# Patient Record
Sex: Male | Born: 1950 | Race: White | Hispanic: No | Marital: Married | Smoking: Never smoker
Health system: Southern US, Community
[De-identification: ages and names within clinical notes are randomized; demographics above are authoritative.]

## PROBLEM LIST (undated history)

## (undated) DIAGNOSIS — Z98811 Dental restoration status: Secondary | ICD-10-CM

## (undated) DIAGNOSIS — N189 Chronic kidney disease, unspecified: Secondary | ICD-10-CM

## (undated) DIAGNOSIS — M75120 Complete rotator cuff tear or rupture of unspecified shoulder, not specified as traumatic: Secondary | ICD-10-CM

## (undated) DIAGNOSIS — J45909 Unspecified asthma, uncomplicated: Secondary | ICD-10-CM

## (undated) DIAGNOSIS — R0981 Nasal congestion: Secondary | ICD-10-CM

## (undated) DIAGNOSIS — I499 Cardiac arrhythmia, unspecified: Secondary | ICD-10-CM

## (undated) DIAGNOSIS — M7521 Bicipital tendinitis, right shoulder: Secondary | ICD-10-CM

## (undated) DIAGNOSIS — H269 Unspecified cataract: Secondary | ICD-10-CM

## (undated) DIAGNOSIS — E119 Type 2 diabetes mellitus without complications: Secondary | ICD-10-CM

## (undated) DIAGNOSIS — E7800 Pure hypercholesterolemia, unspecified: Secondary | ICD-10-CM

## (undated) DIAGNOSIS — Z794 Long term (current) use of insulin: Secondary | ICD-10-CM

## (undated) DIAGNOSIS — M24111 Other articular cartilage disorders, right shoulder: Secondary | ICD-10-CM

## (undated) DIAGNOSIS — Z87448 Personal history of other diseases of urinary system: Secondary | ICD-10-CM

## (undated) DIAGNOSIS — IMO0001 Reserved for inherently not codable concepts without codable children: Secondary | ICD-10-CM

## (undated) DIAGNOSIS — M1990 Unspecified osteoarthritis, unspecified site: Secondary | ICD-10-CM

## (undated) DIAGNOSIS — L908 Other atrophic disorders of skin: Secondary | ICD-10-CM

## (undated) HISTORY — PX: TONSILLECTOMY: SUR1361

## (undated) HISTORY — PX: KNEE ARTHROSCOPY: SHX127

## (undated) HISTORY — PX: TONSILLECTOMY: SHX5217

## (undated) HISTORY — PX: SHOULDER ARTHROSCOPY: SHX128

## (undated) HISTORY — PX: EYE SURGERY: SHX253

## (undated) HISTORY — PX: WRIST SURGERY: SHX841

## (undated) HISTORY — DX: Chronic kidney disease, unspecified: N18.9

---

## 1997-08-05 ENCOUNTER — Ambulatory Visit (HOSPITAL_BASED_OUTPATIENT_CLINIC_OR_DEPARTMENT_OTHER): Admission: RE | Admit: 1997-08-05 | Discharge: 1997-08-05 | Payer: Self-pay | Admitting: Orthopedic Surgery

## 1998-06-02 ENCOUNTER — Ambulatory Visit (HOSPITAL_BASED_OUTPATIENT_CLINIC_OR_DEPARTMENT_OTHER): Admission: RE | Admit: 1998-06-02 | Discharge: 1998-06-02 | Payer: Self-pay | Admitting: Orthopedic Surgery

## 2002-02-02 ENCOUNTER — Ambulatory Visit (HOSPITAL_COMMUNITY): Admission: RE | Admit: 2002-02-02 | Discharge: 2002-02-02 | Payer: Self-pay | Admitting: Gastroenterology

## 2004-05-29 ENCOUNTER — Ambulatory Visit: Payer: Self-pay | Admitting: Internal Medicine

## 2004-05-30 ENCOUNTER — Ambulatory Visit (HOSPITAL_COMMUNITY): Admission: RE | Admit: 2004-05-30 | Discharge: 2004-05-30 | Payer: Self-pay | Admitting: Internal Medicine

## 2004-07-12 ENCOUNTER — Ambulatory Visit: Payer: Self-pay | Admitting: Internal Medicine

## 2005-08-17 ENCOUNTER — Ambulatory Visit: Payer: Self-pay | Admitting: Internal Medicine

## 2005-08-21 ENCOUNTER — Ambulatory Visit: Payer: Self-pay | Admitting: Internal Medicine

## 2005-08-30 ENCOUNTER — Ambulatory Visit: Payer: Self-pay | Admitting: Internal Medicine

## 2005-10-23 ENCOUNTER — Ambulatory Visit: Payer: Self-pay | Admitting: Internal Medicine

## 2005-10-26 ENCOUNTER — Ambulatory Visit: Payer: Self-pay | Admitting: Internal Medicine

## 2005-10-30 ENCOUNTER — Ambulatory Visit: Payer: Self-pay | Admitting: Internal Medicine

## 2005-11-06 ENCOUNTER — Ambulatory Visit: Payer: Self-pay | Admitting: Internal Medicine

## 2005-11-09 ENCOUNTER — Ambulatory Visit: Payer: Self-pay | Admitting: Internal Medicine

## 2005-11-13 ENCOUNTER — Ambulatory Visit: Payer: Self-pay | Admitting: Internal Medicine

## 2005-11-16 ENCOUNTER — Ambulatory Visit: Payer: Self-pay | Admitting: Internal Medicine

## 2005-11-20 ENCOUNTER — Ambulatory Visit: Payer: Self-pay | Admitting: Internal Medicine

## 2005-11-21 ENCOUNTER — Ambulatory Visit: Payer: Self-pay | Admitting: Internal Medicine

## 2005-11-27 ENCOUNTER — Ambulatory Visit: Payer: Self-pay | Admitting: Internal Medicine

## 2005-11-30 ENCOUNTER — Ambulatory Visit: Payer: Self-pay | Admitting: Internal Medicine

## 2005-12-04 ENCOUNTER — Ambulatory Visit: Payer: Self-pay | Admitting: Internal Medicine

## 2005-12-07 ENCOUNTER — Ambulatory Visit: Payer: Self-pay | Admitting: Internal Medicine

## 2005-12-11 ENCOUNTER — Ambulatory Visit: Payer: Self-pay | Admitting: Internal Medicine

## 2005-12-14 ENCOUNTER — Ambulatory Visit: Payer: Self-pay | Admitting: Internal Medicine

## 2005-12-18 ENCOUNTER — Ambulatory Visit: Payer: Self-pay | Admitting: Internal Medicine

## 2005-12-21 ENCOUNTER — Ambulatory Visit: Payer: Self-pay | Admitting: Internal Medicine

## 2005-12-25 ENCOUNTER — Ambulatory Visit: Payer: Self-pay | Admitting: Internal Medicine

## 2005-12-28 ENCOUNTER — Ambulatory Visit: Payer: Self-pay | Admitting: Internal Medicine

## 2005-12-31 ENCOUNTER — Ambulatory Visit: Payer: Self-pay | Admitting: Internal Medicine

## 2006-01-03 ENCOUNTER — Ambulatory Visit: Payer: Self-pay | Admitting: Internal Medicine

## 2006-01-08 ENCOUNTER — Ambulatory Visit: Payer: Self-pay | Admitting: Internal Medicine

## 2006-01-10 ENCOUNTER — Ambulatory Visit: Payer: Self-pay | Admitting: Internal Medicine

## 2006-01-11 ENCOUNTER — Ambulatory Visit: Payer: Self-pay | Admitting: Internal Medicine

## 2006-01-14 ENCOUNTER — Ambulatory Visit: Payer: Self-pay | Admitting: Internal Medicine

## 2006-01-17 ENCOUNTER — Ambulatory Visit: Payer: Self-pay | Admitting: Internal Medicine

## 2006-01-21 ENCOUNTER — Ambulatory Visit: Payer: Self-pay | Admitting: Internal Medicine

## 2006-01-22 ENCOUNTER — Ambulatory Visit: Payer: Self-pay | Admitting: Internal Medicine

## 2006-01-24 ENCOUNTER — Ambulatory Visit: Payer: Self-pay | Admitting: Internal Medicine

## 2006-01-29 ENCOUNTER — Ambulatory Visit: Payer: Self-pay | Admitting: Internal Medicine

## 2006-02-05 ENCOUNTER — Ambulatory Visit: Payer: Self-pay | Admitting: Internal Medicine

## 2006-02-11 ENCOUNTER — Ambulatory Visit: Payer: Self-pay | Admitting: Internal Medicine

## 2006-02-15 ENCOUNTER — Ambulatory Visit: Payer: Self-pay | Admitting: Internal Medicine

## 2006-02-19 ENCOUNTER — Ambulatory Visit: Payer: Self-pay | Admitting: Internal Medicine

## 2006-02-19 ENCOUNTER — Ambulatory Visit (HOSPITAL_COMMUNITY): Admission: RE | Admit: 2006-02-19 | Discharge: 2006-02-19 | Payer: Self-pay | Admitting: Internal Medicine

## 2006-02-22 ENCOUNTER — Ambulatory Visit: Payer: Self-pay | Admitting: Internal Medicine

## 2006-03-14 ENCOUNTER — Ambulatory Visit: Payer: Self-pay | Admitting: Internal Medicine

## 2006-09-10 ENCOUNTER — Ambulatory Visit: Payer: Self-pay | Admitting: Internal Medicine

## 2007-08-08 ENCOUNTER — Ambulatory Visit: Payer: Self-pay | Admitting: Internal Medicine

## 2007-09-08 DIAGNOSIS — E119 Type 2 diabetes mellitus without complications: Secondary | ICD-10-CM

## 2007-09-08 DIAGNOSIS — J3089 Other allergic rhinitis: Secondary | ICD-10-CM

## 2007-09-08 DIAGNOSIS — J302 Other seasonal allergic rhinitis: Secondary | ICD-10-CM | POA: Insufficient documentation

## 2007-09-08 DIAGNOSIS — J209 Acute bronchitis, unspecified: Secondary | ICD-10-CM | POA: Insufficient documentation

## 2007-09-09 ENCOUNTER — Ambulatory Visit: Payer: Self-pay | Admitting: Internal Medicine

## 2008-05-19 ENCOUNTER — Ambulatory Visit: Payer: Self-pay | Admitting: Internal Medicine

## 2008-06-07 LAB — COMPREHENSIVE METABOLIC PANEL
ALT: 22 U/L (ref 0–53)
AST: 22 U/L (ref 0–37)
Albumin: 4.1 g/dL (ref 3.5–5.2)
Alkaline Phosphatase: 46 U/L (ref 39–117)
CO2: 28 mEq/L (ref 19–32)
Calcium: 9.3 mg/dL (ref 8.4–10.5)
Creatinine, Ser: 1.13 mg/dL (ref 0.40–1.50)
Total Bilirubin: 0.6 mg/dL (ref 0.3–1.2)
Total Protein: 6 g/dL (ref 6.0–8.3)

## 2008-06-07 LAB — CBC WITH DIFFERENTIAL/PLATELET
Basophils Absolute: 0.1 10*3/uL (ref 0.0–0.1)
Eosinophils Absolute: 0.3 10*3/uL (ref 0.0–0.5)
HCT: 39.8 % (ref 38.4–49.9)
HGB: 13.6 g/dL (ref 13.0–17.1)
LYMPH%: 17.7 % (ref 14.0–49.0)
MCH: 33 pg (ref 27.2–33.4)
MCV: 97 fL (ref 79.3–98.0)
MONO#: 0.4 10*3/uL (ref 0.1–0.9)
NEUT%: 70.4 % (ref 39.0–75.0)
RDW: 11.9 % (ref 11.0–14.6)
WBC: 6.4 10*3/uL (ref 4.0–10.3)

## 2008-06-07 LAB — LACTATE DEHYDROGENASE: LDH: 171 U/L (ref 94–250)

## 2008-09-07 ENCOUNTER — Ambulatory Visit: Payer: Self-pay | Admitting: Internal Medicine

## 2008-09-08 ENCOUNTER — Encounter: Payer: Self-pay | Admitting: Internal Medicine

## 2008-09-09 ENCOUNTER — Telehealth (INDEPENDENT_AMBULATORY_CARE_PROVIDER_SITE_OTHER): Payer: Self-pay | Admitting: *Deleted

## 2009-09-06 ENCOUNTER — Ambulatory Visit: Payer: Self-pay | Admitting: Internal Medicine

## 2009-12-27 ENCOUNTER — Encounter: Payer: Self-pay | Admitting: Internal Medicine

## 2010-03-14 ENCOUNTER — Telehealth (INDEPENDENT_AMBULATORY_CARE_PROVIDER_SITE_OTHER): Payer: Self-pay | Admitting: *Deleted

## 2010-03-15 ENCOUNTER — Telehealth: Payer: Self-pay | Admitting: Internal Medicine

## 2010-04-27 NOTE — Assessment & Plan Note (Signed)
Summary: 12 months/apc   Primary Provider/Referring Provider:  Catha Gosselin   History of Present Illness: History of Present Illness: 09/09/07- 60-year-old man returning for one-year follow-up of asthma and allergic rhinitis.  Diabetes is his complicating problem.  He says this has been a very good year from a respiratory standpoint.  He considers symbicort wonderful, saying that it gave him his life back.  He has not needed a rescue inhaler.  We discussed medications and and environmental precautions. Denies headache, sinus drainage, sneezing chest pain, dyspnea, n/v/d, weight loss, fever, edema.  10-07-08- Asthma, allergic rhinitis Symbicort "gave him his life  back" without side effects or concerns. Uses Claritin daily, adding Zyrtec if he walks at lunch. Flonase also helps- denies nose bleeds.Meredeth Ide daily. Needs med refills as discussed.  September 06, 2009- Asthma, allergic rhinitis, bradyarrythmia In pollen season he notes some mid-day cough despite using Symbicort. Sneezes and some nasal itching. He does use claritin, supplementing with Zyrtec if needed. Has not had a rescue inhaler. Known slow/ irregfular heart rate followed by Dr Abigail Butts cardiology.   Preventive Screening-Counseling & Management  Alcohol-Tobacco     Smoking Status: never  Current Medications (verified): 1)  Lantus 100 Unit/ml  Soln (Insulin Glargine) .... Take 12 Units Daily 2)  Humalog 100 Unit/ml  Soln (Insulin Lispro (Human)) .... Take 18-20 Units Daily 3)  Pravachol 40 Mg  Tabs (Pravastatin Sodium) .... Take 1 Tablet By Mouth Once A Day 4)  Claritin-D 12 Hour 5-120 Mg  Tb12 (Loratadine-Pseudoephedrine) .... As Needed 5)  Zyrtec Allergy 10 Mg  Tabs (Cetirizine Hcl) 6)  Symbicort 160-4.5 Mcg/act  Aero (Budesonide-Formoterol Fumarate) .... Inhale 2 Puffs Two Times A Day 7)  Flonase 50 Mcg/act  Susp (Fluticasone Propionate) 8)  Mucinex Dm 30-600 Mg  Tb12 (Dextromethorphan-Guaifenesin) .... As  Needed  Allergies (verified): No Known Drug Allergies  Past History:  Past Medical History: Last updated: 09/09/2007 DIABETES MELLITUS (ICD-250.00) ALLERGIC RHINITIS (ICD-477.9) ASTHMATIC BRONCHITIS, ACUTE (ICD-466.0)  Past Surgical History: Last updated: 2008-10-07 Knees shoulders Tonsils  Family History: Last updated: October 07, 2008 Mother- died metastatic breast cancer Father- died respiratory failure- pulmonary edema Brother has Parkinson's  Social History: Last updated: 07-Oct-2008 Patient never smoked.  Married Attorney [employer]  Risk Factors: Smoking Status: never (09/06/2009)  Review of Systems      See HPI  The patient denies shortness of breath with activity, shortness of breath at rest, productive cough, non-productive cough, coughing up blood, chest pain, irregular heartbeats, acid heartburn, indigestion, loss of appetite, weight change, abdominal pain, difficulty swallowing, sore throat, tooth/dental problems, headaches, nasal congestion/difficulty breathing through nose, and sneezing.    Vital Signs:  Patient profile:   60 year old male Height:      72 inches Weight:      183 pounds BMI:     24.91 O2 Sat:      96 % on Room air Pulse rate:   48 / minute BP sitting:   122 / 84  (left arm) Cuff size:   regular  Vitals Entered By: Reynaldo Minium CMA (September 06, 2009 4:29 PM)  O2 Flow:  Room air   Physical Exam  Additional Exam:  GENERAL:  A/Ox3; pleasant & cooperative.NAD HEENT:  [place]/AT, EOM-wnl, PERRLA, EACs-clear, TMs-wnl, NOSE-csome bloody crusitng on mucosar, THROAT-clear & wnl., Mallampati II, mild hoarseness NECK:  Supple w/ fair ROM; no JVD; normal carotid impulses w/o bruits; no thyromegaly or nodules palpated; no lymphadenopathy. CHEST:Clear to P&A HEART: Slow- 48/min, bigeminal rhythm, no  m/r/g  heard ABDOMEN:  Soft & nt;  EXT: Warm bilat,  no calf pain, edema, clubbing, pulses intact Skin: no rash/lesion     Impression &  Recommendations:  Problem # 1:  ALLERGIC RHINITIS (ICD-477.9)  Fair control. Not using a steroid inhaler now. We discussed availability of Allegra as a comparison.; His updated medication list for this problem includes:    Zyrtec Allergy 10 Mg Tabs (Cetirizine hcl)    Flonase 50 Mcg/act Susp (Fluticasone propionate)  Orders: Est. Patient Level III ([address])  Problem # 2:  ASTHMATIC BRONCHITIS, ACUTE (ICD-466.0)  Very good control, except in midday sometimes. i will give him a rescue inhaler. Reason and technique for rescue inhaler were reviewed. His updated medication list for this problem includes:    Claritin-d 12 Hour 5-120 Mg Tb12 (Loratadine-pseudoephedrine) .Marland Kitchen... As needed    Symbicort 160-4.5 Mcg/act Aero (Budesonide-formoterol fumarate) ..... Inhale 2 puffs two times a day    Mucinex Dm 30-600 Mg Tb12 (Dextromethorphan-guaifenesin) .Marland Kitchen... As needed    Proair Hfa 108 (90 Base) Mcg/act Aers (Albuterol sulfate) .Marland Kitchen... 2 puffs, four times a day as needed rescue inhaler  Orders: Est. Patient Level III (60454)  Medications Added to Medication List This Visit: 1)  Proair Hfa 108 (90 Base) Mcg/act Aers (Albuterol sulfate) .... 2 puffs, four times a day as needed rescue inhaler  Patient Instructions: 1)  Please schedule a follow-up appointment in 1 year. 2)  Script for rescue inhaler sent to your drug store 3)  Call as needed for refills or problems Prescriptions: PROAIR HFA 108 (90 BASE) MCG/ACT AERS (ALBUTEROL SULFATE) 2 puffs, four times a day as needed rescue inhaler  #1 x prn   Entered and Authorized by:   Waymon Budge MD   Signed by:   Waymon Budge MD on 09/06/2009   Method used:   Electronically to        [facility]. [phone]* (retail)       [address]       [place], [place]  [address]       Ph: [phone]       Fax: [phone]   RxID:   [id number]

## 2010-04-27 NOTE — Progress Notes (Signed)
Summary: refill / [facility]  Phone Note Call from Patient   Caller: Patient Call For: young Summary of Call: pt requests refill of symbicort 90 days x 3 refills- [facility]. pt [phone] Initial call taken by: Tivis Ringer, CNA,  March 14, 2010 9:51 AM  Follow-up for Phone Call        Rx sent to [facility] with pt and notified this was done. Follow-up by: Vernie Murders,  March 14, 2010 11:12 AM    Prescriptions: SYMBICORT 160-4.5 MCG/ACT  AERO (BUDESONIDE-FORMOTEROL FUMARATE) Inhale 2 puffs two times a day  #3 x 3   Entered by:   Vernie Murders   Authorized by:   Waymon Budge MD   Signed by:   Vernie Murders on 03/14/2010   Method used:   Electronically to        [facility]* (retail)             ,          Ph: [phone]       Fax: [phone]   RxID:   [id number]

## 2010-04-27 NOTE — Progress Notes (Signed)
Summary: symbicort  Phone Note Call from Patient Call back at Home Phone [phone] Call back at [phone]   Caller: Patient Call For: young Reason for Call: Talk to Nurse Summary of Call: Patient requesting sample of symbicort.  Also, we sent rx to [facility], but when patient called [facility] they said they had a question about rx and we hadn't responded yet.  I told patient that nothing was documented in chart about [facility] contacting us.  Patient did not know what question was.  Can we contact [facility] to see what is going on with refill? Initial call taken by: Lehman Prom,  March 15, 2010 3:29 PM  Follow-up for Phone Call        I called [facility] and they state there is nothign needed for the symbicort Rx. I has actually been shipped out. I spoke to the pt and he states he is out of symbicort so I left a sample to last until shipment arrives. Nothinf further needed. Pt aware.Carron Curie CMA  March 15, 2010 5:25 PM

## 2010-04-27 NOTE — Medication Information (Signed)
Summary: Therapy Consideration for Symbicort/[facility]  Therapy Consideration for Symbicort/[facility]   Imported By: Sherian Rein 12/30/2009 14:07:35  _____________________________________________________________________  External Attachment:    Type:   Image     Comment:   External Document

## 2010-08-03 ENCOUNTER — Encounter: Payer: Self-pay | Admitting: Internal Medicine

## 2010-08-07 ENCOUNTER — Ambulatory Visit (INDEPENDENT_AMBULATORY_CARE_PROVIDER_SITE_OTHER): Payer: Federal, State, Local not specified - PPO | Admitting: Internal Medicine

## 2010-08-07 ENCOUNTER — Encounter: Payer: Self-pay | Admitting: Internal Medicine

## 2010-08-07 VITALS — BP 124/72 | HR 39 | Ht 72.0 in | Wt 177.8 lb

## 2010-08-07 DIAGNOSIS — J45909 Unspecified asthma, uncomplicated: Secondary | ICD-10-CM

## 2010-08-07 DIAGNOSIS — J4489 Other specified chronic obstructive pulmonary disease: Secondary | ICD-10-CM | POA: Insufficient documentation

## 2010-08-07 DIAGNOSIS — J449 Chronic obstructive pulmonary disease, unspecified: Secondary | ICD-10-CM | POA: Insufficient documentation

## 2010-08-07 DIAGNOSIS — J309 Allergic rhinitis, unspecified: Secondary | ICD-10-CM

## 2010-08-07 DIAGNOSIS — J4520 Mild intermittent asthma, uncomplicated: Secondary | ICD-10-CM

## 2010-08-07 MED ORDER — ALBUTEROL SULFATE HFA 108 (90 BASE) MCG/ACT IN AERS: 2.0000 | INHALATION_SPRAY | Freq: Four times a day (QID) | RESPIRATORY_TRACT | Status: DC | PRN

## 2010-08-07 MED ORDER — MOMETASONE FUROATE 50 MCG/ACT NA SUSP: 2.0000 | Freq: Every day | NASAL | Status: DC

## 2010-08-07 MED ORDER — BUDESONIDE-FORMOTEROL FUMARATE 80-4.5 MCG/ACT IN AERO: 2.0000 | INHALATION_SPRAY | Freq: Two times a day (BID) | RESPIRATORY_TRACT | Status: DC

## 2010-08-07 NOTE — Patient Instructions (Signed)
Meds refilled. Symbicort changed to the 80/4.5 strength- just lest cortisone. It may taqke a couple of months to decide if this affects your skin or your asthma. Let us hear if you have concerns.

## 2010-08-07 NOTE — Assessment & Plan Note (Signed)
We discussed control. He is using more than usual antihistamine currently, but not in a range that concerns me.

## 2010-08-07 NOTE — Progress Notes (Signed)
  Subjective:    Patient ID: Robert Baker, male    DOB: July 23, 1950, 60 y.o.   MRN: [id number]  HPI 08/07/10- 60 yoM never smoker, followed for asthma and allergic rhinitis, complicated by DM. Last here- September 06, 2009 when he was noting some persistent cough despite Symbicort. Bradycardia was followed by [facility] cardiology and not an issue. . This Spring he has needed to use a rescue inhaler about twice weekly- ? Due to pollen. Eyes have itched more and has felt some throat congestion with a little morning phlegm. Not being wakened and little aware of wheeze.  He continues to be physically quite active.   Review of Systems Constitutional:   No weight loss, night sweats,  Fevers, chills, fatigue, lassitude. HEENT:   No headaches,  Difficulty swallowing,  Tooth/dental problems,  Sore throat,            CV:  No- chest pain,  Orthopnea, PND, swelling in lower extremities, anasarca, dizziness, palpitations  GI  No- heartburn, indigestion, abdominal pain, nausea, vomiting, diarrhea, change in bowel habits, loss of appetite  Resp: No- shortness of breath with exertion or at rest.  No excess mucus, no productive cough,  No non-productive cough,  No coughing up of blood.  No change in color of mucus.  No wheezing. Skin: no rash or lesions.  GU: no- dysuria, change in color of urine, no urgency or frequency.  No flank pain.  MS:  No- joint pain or swelling.  No decreased range of motion.  No back pain.  Psych:  No- change in mood or affect. No depression or anxiety.  No memory loss. Skin- notes easy shearing and tearing of skin     Objective:   Physical Exam General- Alert, Oriented, Affect-appropriate, Distress- none acute  Skin- rash-none, lesions- none, excoriation- none  Lymphadenopathy- none  Head- atraumatic  Eyes- Gross vision intact, PERRLA, conjunctivae clear, secretions  Ears- Hearing, canals, Tm  -Normal  Nose- Clear,  No- Septal dev, mucus, polyps, erosion, perforation   Throat-  Mallampati II , mucosa clear , drainage- none, tonsils- atrophic  Neck- flexible , trachea midline, no stridor , thyroid nl, carotid no bruit  Chest - symmetrical excursion , unlabored     Heart/CV- RRR , no murmur , no gallop  , no rub, nl s1 s2                     - JVD- none , edema- none, stasis changes- none, varices- none     Lung- clear to P&A, wheeze- none, cough- none , dullness-none, rub- none     Chest wall-  Abd- tender-no, distended-no, bowel sounds-present, HSM- no  Br/ Gen/ Rectal- Not done, not indicated  Extrem- cyanosis- none, clubbing, none, atrophy- none, strength- nl  Neuro- grossly intact to observation         Assessment & Plan:

## 2010-08-07 NOTE — Assessment & Plan Note (Signed)
He has been well controlled with minor seasonal pollen exacerbation this Spring. With his observation that skin tears more easily, we will change Symbicort to 80/4.5 and see if that makes a difference.

## 2010-08-08 NOTE — Assessment & Plan Note (Signed)
[facility]                             PULMONARY OFFICE NOTE   Robert Baker, Robert Baker                           MRN:          [id number]  DATE:09/10/2006                            DOB:          May 03, 1950    PROBLEM:  1. Asthmatic bronchitis.  2. Allergic rhinitis.  3. Diabetes.   HISTORY:  He has continued to do extremely well using Symbicort 160/4.5.  He walks outdoors regularly and is using an exercise bicycle in his home  quite a lot.  He sees no problems with continuing present therapy.  We  discussed air quality.   MEDICATIONS:  1. Insulin.  2. Pravachol 40 mg.  3. Ferritin or Zyrtec.  4. Symbicort 160/4.5.  5. Flonase.  6. P.r.n. use of Mucinex.  7. Rescue albuterol inhaler.   No medication allergy.   OBJECTIVE:  Weight 183 pounds, BP 110/66.  Pulse regular 63.  Room air  saturation 98%.  He looks relaxed and comfortable.  Conjunctivae and nasal mucosa are normal.  CHEST:  Clear.  HEART:  Sounds are regular without murmur.   IMPRESSION:  1. Cough equivalent asthma/bronchitis.  2. Rhinitis.   PLAN:  Continue Symbicort.  Call for help earlier.  Otherwise, schedule  return in 1 year p.r.n.     Clinton D. Maple Hudson, MD, [facility] Bollman, FACP  Electronically Signed    CDY/MedQ  DD: 09/10/2006  DT: 09/11/2006  Job #: [id number]   cc:   Caryn Bee L. Little, M.D.

## 2010-08-09 ENCOUNTER — Telehealth: Payer: Self-pay | Admitting: Internal Medicine

## 2010-08-09 NOTE — Telephone Encounter (Signed)
Spoke with [facility]-gave correct Rx.Vivianne Spence

## 2010-08-11 NOTE — Assessment & Plan Note (Signed)
[facility]                               PULMONARY OFFICE NOTE   MAYFORD, ALBERG                           MRN:          [id number]  DATE:02/11/2006                            DOB:          12/24/1950    HISTORY:  Since I had last seen him in late September, he was seen October  18 by the nurse practitioner with acute tracheobronchitis and early  sinusitis for which he was put on 10 days of Augmentin and Mucinex DM. He  returns now saying that he has got a hacking cough 2 hours after eating and  he coughs all night. He coughs up chunks of white phlegm and hears himself  wheeze. He does not remember Advair in the past being very helpful. Cold air  now bothers his throat and chest. Chest x-ray in May had been normal.  Spirometry in April 2006 had been almost normal with minimal small airway  flow reduction responsive to bronchodilator. He has had a flu vaccine.   MEDICATIONS:  1. Insulin.  2. Pravachol 40 mg.  3. Zetia 10 mg.  4. Nasonex.  5. Either Claritin or Zyrtec.  6. Albuterol rescue inhaler.   OBJECTIVE:  VITAL SIGNS:  Weight 184 pounds, BP 110/68, pulse regular 54,  room air saturation 96%.  LUNGS:  Now clear with no cough or wheeze, work of breathing is not  increased. There is no neck vein distention or stridor.  HEART:  Sounds are regular without murmur.   IMPRESSION:  Exacerbation of bronchitis with asthma. I am concerned about  the possibility he may be aspirating.   PLAN:  1. Symbicort 160/4.5 two puffs b.i.d.  2. Schedule modified barium swallow with speech therapy assistance.  3. Schedule return in 1 month, earlier p.r.n.     Clinton D. Maple Hudson, MD, Tonny Bollman, FACP  Electronically Signed    CDY/MedQ  DD: 02/11/2006  DT: 02/12/2006  Job #: [id number]   cc:   Caryn Bee L. Little, M.D.

## 2010-08-11 NOTE — Assessment & Plan Note (Signed)
[facility]                               PULMONARY OFFICE NOTE   Robert Baker, Robert Baker                           MRN:          [id number]  DATE:12/31/2005                            DOB:          [birth year]    HISTORY OF PRESENT ILLNESS:  The patient is a 60 year old white male patient  of Dr. Roxy Cedar, who has a known history of asthmatic bronchitis and allergic  rhinitis.  The patient presents with a 2-week history of productive cough  with thick yellow sputum, nasal congestion and sinus pain and pressure.  The  patient denies any hemoptysis, orthopnea, PND or leg swelling.  No recent  travel or antibiotic use.   PAST MEDICAL HISTORY:  Reviewed.   CURRENT MEDICATIONS:  Reviewed.   PHYSICAL EXAMINATION:  The patient is a pleasant male in no acute distress.  He is afebrile, stable vital signs.  Pulse recheck is 64.  HEENT:  Nasal mucosa shows some mild erythema.  Nontender sinuses to  percussion.  The posterior pharynx is clear.  NECK:  Supple without cervical adenopathy.  No JVD.  LUNGS:  Lung sounds reveal coarse breath sounds without any wheezing or  crackles.  CARDIAC:  Regular rate and rhythm.  ABDOMEN:  Soft and benign.  EXTREMITIES:  Warm without any edema.   IMPRESSION AND PLAN:  Acute tracheobronchitis with a probable early  sinusitis.  The patient is to begin Augmentin for 10 days.  Use Mucinex DM  twice a day.  Nasal hygiene regimen as recommended.  The patient is to  return with Dr. Maple Hudson as recommended, or sooner if needed.  Endal HD as  needed for cough, #8 ounces without refills was given.      ______________________________  Rubye Oaks, NP    ______________________________  Rennis Chris. Maple Hudson, MD, [facility], FACP     TP/MedQ  DD:  01/10/2006  DT:  01/13/2006  Job #:  [id number]

## 2010-08-11 NOTE — Assessment & Plan Note (Signed)
[facility]                               PULMONARY OFFICE NOTE   MANOJ, ENRIQUEZ                           MRN:          [id number]  DATE:12/18/2005                            DOB:          October 02, 1950    PROBLEM:  1. Allergic rhinitis.  2. Persistent asthmatic bronchitis.  3. Rhinosinusitis.  4. Diabetes.   HISTORY:  He comes in to get his allergy vaccine filled up at this office  with no problems.  In the last two or three weeks, typical fall season, he  has noticed a little increased chest congestion and says he tends to cough  up mucus right after eating.  Sometimes he will wake at night and cough up  the same mucus.  He blames increased pollen for the sense of retro-orbital  pressure and nasal congestion.  He is on Mucinex and we discussed whether  that would increase the amount he needed to cough up or help clear it.  He  has been using his Advair inhaler only on an occasional p.r.n. basis and we  discussed appropriate use of that as a maintenance medicine.  He continues  to walk daily.   MEDICATIONS:  1. Insulin.  2. Pravachol 40 mg.  3. Zetia 10 mg.  4. Nasonex.  5. Occasional Claritin.  6. Occasional Advair 100/50.  7. Mucinex.  8. Sina-Fresh nasal rinse.   ALLERGIES:  No medication allergy.   OBJECTIVE:  VITAL SIGNS:  Weight 182 pounds, blood pressure 104/80, pulse  regular and 56, room air saturation 97%.  HEENT:  Nose and throat are distinctly clear.  There is no unusual amount of  mucus.  No evidence of inflammation or postnasal drip.  Voice quality is  normal.  NECK:  I find no adenopathy.  LUNGS:  Clear.  He admits that at the time of exam he feels quite  comfortable.  HEART:  Heart sounds are regular without murmur or gallop.   IMPRESSION:  This may be all his allergic rhinitis and a mild __________  asthma with past history of sinusitis, but association with meals does raise  question of whether or not he is having  some laryngeal penetration and we  discussed the possibility of a modified barium swallow.  He is going to pay  more attention to this and be more deliberate about his swallowing.  Meanwhile he will continue Nasonex, try changing from Claritin to Zyrtec 10  mg daily, and I have filled an albuterol rescue inhaler for him with  discussion.  Will schedule return for now to be in two  months.  By that time he will have reached maintenance on his allergy  vaccine.  Earlier p.r.n. and he is encouraged to call p.r.n.                                   Clinton D. Maple Hudson, MD, FCCP, FACP   CDY/MedQ  DD:  12/19/2005  DT:  12/20/2005  Job #:  [id number]  cc:   Caryn Bee L. Little, M.D.  Gloris Manchester. Lazarus Salines, M.D.  Dorisann Frames, M.D.

## 2010-08-11 NOTE — Op Note (Signed)
   NAME:  Robert Baker, Robert Baker NO.:  [id number]   MEDICAL RECORD NO.:  [id number]                   PATIENT TYPE:  AMB   LOCATION:  ENDO                                 FACILITY:  [facility]   PHYSICIAN:  Petra Kuba, M.D.                 DATE OF BIRTH:  Jan 18, 1951   DATE OF PROCEDURE:  02/02/2002  DATE OF DISCHARGE:                                 OPERATIVE REPORT   PROCEDURE PERFORMED:  Colonoscopy.   ENDOSCOPIST:  Petra Kuba, M.D.   INDICATIONS FOR PROCEDURE:  Patient with family history of colon polyps.  Due for colonic screening.  Consent was signed after the risks, benefits,  methods and options were thoroughly discussed in the office.   MEDICINES USED:  Demerol 70 mg, Versed 7 mg.   DESCRIPTION OF PROCEDURE:  Rectal inspection was pertinent for external  hemorrhoids, small.  Digital exam was negative.  A video pediatric  adjustable colonoscope was inserted and easily advanced around the colon to  the cecum.  This required some abdominal pressure but no position changes.  No obvious abnormality seen on insertion.  The cecum was identified by the  appendiceal orifice and the ileocecal valve.  In fact, the scope was  inserted a short ways into the terminal ileum, which was normal.  Photodocumentation was obtained.  The prep was adequate.  There was some  liquid stool that required washing and suctioning.  On slow withdrawal  through the colon no abnormalities were seen as we slowly withdrew back to  the rectum.  Once back to the rectum, the scope was retroflexed, pertinent  for some internal hemorrhoids.  Scope was straightened, and readvanced a  short ways up the left side of the colon.  Air was suctioned, scope removed.  The patient tolerated the procedure well without obvious complication.   ENDOSCOPIC DIAGNOSIS:  1. Internal and external hemorrhoids.  2. Otherwise within normal limits to the terminal ileum.   PLAN:  Yearly rectals and guaiacs  per Dr. Clarene Duke.  Happy to see back p.r.n.  Otherwise recommend repeat colonic screening in five years.                                               Petra Kuba, M.D.    MEM/MEDQ  D:  02/02/2002  T:  02/02/2002  Job:  [id number]   cc:   Caryn Bee L. Little, M.D.  [address]  [place]  [place] [address]  Fax: [phone]

## 2010-08-11 NOTE — Assessment & Plan Note (Signed)
[name]                               PULMONARY OFFICE NOTE   Robert, Baker                           MRN:          [id number]  DATE:10/23/2005                            DOB:          [birth year]    PROBLEMS:  1.  Allergic rhinitis.  2.  Persistent asthmatic bronchitis.  3.  Rhinosinusitis.   HISTORY OF PRESENT ILLNESS:  He returns today for allergy testing reporting  that Robert Baker had little effect.  He feels more congested in his head after  the evening dinner meal.  He has been using a saline nasal kettle and  SinoFresh.  Both of these seemed to help quite a bit for awhile, but  gradually he has felt more congestion and sneeze without headache.   MEDICATIONS:  Lantus, Humalog insulin, Pravachol, Zetia, Allegra 180,  Singulair, Robert Baker, Advair 100/50, Mucinex, SinoFresh.   ALLERGIES:  NO KNOWN DRUG ALLERGIES.   OBJECTIVE:  VITAL SIGNS:  Weight 180 pounds, BP 142/70, pulse regular and  60, room air saturation 94%.  HEENT:  Conjunctivae are no injected.  He might be minimally puffy under the  eyes, but I am not sure that is real.  Nasal airway is not frankly  obstructed, but mucosa looks a bit pale.  There is white mucus present.  Pharynx is not red.  LUNGS:  Clear of wheeze or cough.  HEART:  Heart sounds are regular without murmur.   SKIN TEST:  Positive histamine, negative diluent controls.  Significant  positive intradermal reactions primarily for grass, weed and tree pollens,  dust mite.   Chest x-ray done Aug 17, 2005, had been read as normal chest.  A CT of the  sinuses done May 30, 2004, had shown changes of sinusitis with fluid levels  in the maxillary sinuses bilaterally.   IMPRESSION:  1.  Allergic rhinitis.  2.  Sinusitis which has been previously addressed.  3.  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   We discussed further treatment options.  He has been consistent with use of  Robert Baker and with his Singulair and  bronchodilator medications.  He is  currently off of prednisone and antibiotics.  He wants to try allergy  vaccine.  We discussed allergy vaccine realistic expectations, limitations  and potential serious reactions including anaphylaxis very carefully.   PLAN:  We will begin allergy vaccine here and build to maintenance.  Scheduled to return with me in 2 months, earlier p.r.n.                                   Robert D. Maple Hudson, MD, FCCP, FACP   CDY/MedQ  DD:  10/24/2005  DT:  10/25/2005  Job #:  [id number]   cc:   Caryn Bee L. Little, MD  Gloris Manchester. Lazarus Salines, MD  Dorisann Frames, MD

## 2010-08-11 NOTE — Assessment & Plan Note (Signed)
[facility]                             PULMONARY OFFICE NOTE   LOTUS, Robert Baker                           MRN:          [id number]  DATE:03/14/2006                            DOB:          1950-06-17    PROBLEM:  1. Asthmatic bronchitis.  2. Allergic rhinitis.  3. Diabetes.   HISTORY:  Symbicort has worked very well.  He says it gave me back my  quality of life and in particular he finds he is sleeping through the  night.  His modified barium swallow did not show aspiration or  penetration but did suggest there might be some reflux.  We discussed  the possibility of a formal barium swallow but with symptomatic relief,  we agreed to use the Symbicort, practice reflux precaution and see what  happened.   MEDICATION:  1. Insulin.  2. Pravachol 40 mg.  3. Zetia 10 mg.  4. Zyrtec.  5. Symbicort 160/4.5.  6. Albuterol rescue inhaler.   No medication allergy.   OBJECTIVE:  Weight 187 pounds.  Blood pressure 102/60.  Pulse regular  63.  Room air saturation 98%.  Quiet, clear chest.  No coughing.  Heart sounds regular without murmur.   IMPRESSION:  Cough secondary to asthma or asthmatic bronchitis.  He  seems to have gotten good relief from Symbicort.  We spent time  discussing steroid side effects and issues of long-acting beta  adrenergic drugs.   PLAN:  1. Symbicort 160/4.5 was refilled.  2. Schedule a return in 6 months, earlier p.r.n.     Clinton D. Maple Hudson, MD, Tonny Bollman, FACP  Electronically Signed    CDY/MedQ  DD: 03/14/2006  DT: 03/15/2006  Job #: [id number]

## 2010-09-08 ENCOUNTER — Ambulatory Visit: Payer: Self-pay | Admitting: Internal Medicine

## 2011-08-07 ENCOUNTER — Encounter: Payer: Self-pay | Admitting: Internal Medicine

## 2011-08-07 ENCOUNTER — Ambulatory Visit (INDEPENDENT_AMBULATORY_CARE_PROVIDER_SITE_OTHER): Payer: Federal, State, Local not specified - PPO | Admitting: Internal Medicine

## 2011-08-07 VITALS — BP 136/80 | HR 42 | Ht 72.0 in | Wt 179.0 lb

## 2011-08-07 DIAGNOSIS — J45998 Other asthma: Secondary | ICD-10-CM

## 2011-08-07 DIAGNOSIS — J45909 Unspecified asthma, uncomplicated: Secondary | ICD-10-CM

## 2011-08-07 DIAGNOSIS — J302 Other seasonal allergic rhinitis: Secondary | ICD-10-CM

## 2011-08-07 DIAGNOSIS — J309 Allergic rhinitis, unspecified: Secondary | ICD-10-CM

## 2011-08-07 NOTE — Patient Instructions (Addendum)
Office spirometry- dx allergic and infective asthma--- done  Sample Dulera 100 steroid/ bronchodilator combo similar to Symbicort      See how 2 puffs and rinse, twice daily, compares with Symbicort

## 2011-08-07 NOTE — Progress Notes (Signed)
Subjective:    Patient ID: Robert Baker, male    DOB: 1950-09-08, 61 y.o.   MRN: [id number]  HPI 08/07/10- 60 yoM never smoker, followed for asthma and allergic rhinitis, complicated by DM. Last here- September 06, 2009 when he was noting some persistent cough despite Symbicort. Bradycardia was followed by [facility] cardiology and not an issue. . This Spring he has needed to use a rescue inhaler about twice weekly- ? Due to pollen. Eyes have itched more and has felt some throat congestion with a little morning phlegm. Not being wakened and little aware of wheeze.  He continues to be physically quite active.   08/07/11-60 yoM never smoker, followed for asthma and allergic rhinitis, complicated by DM. Chest congestion past month-unsure if related to pollen; Feels as he needs to go back up on Symbicort-having wheezing  He tried reducing Symbicort from 160-80, but had to increase Symbicort 80-4 puffs twice daily to keep asthma controlled. Failed Singulair years ago. Uses rescue inhaler about twice a week after increased exertion. Increased nasal congestion and rhinorrhea in the past month but does not use Flonase regularly.  ROS-see HPI Constitutional:   No-   weight loss, night sweats, fevers, chills, fatigue, lassitude. HEENT:   No-  headaches, difficulty swallowing, tooth/dental problems, sore throat,       +sneezing, itching, ear ache, nasal congestion, post nasal drip,  CV:  No-   chest pain, orthopnea, PND, swelling in lower extremities, anasarca, dizziness, palpitations Resp: + shortness of breath with exertion or at rest.              No-   productive cough,  No non-productive cough,  No- coughing up of blood.              No-   change in color of mucus.  + wheezing.   Skin: No-   rash or lesions. GI:  No-   heartburn, indigestion, abdominal pain, nausea, vomiting, GU: . MS:  No-   joint pain or swelling.   Neuro-     nothing unusual Psych:  No- change in mood or affect. No depression or anxiety.   No memory loss.  OBJ- Physical Exam General- Alert, Oriented, Affect-appropriate, Distress- none acute Skin- rash-none, lesions- none, excoriation- none Lymphadenopathy- none Head- atraumatic            Eyes- Gross vision intact, PERRLA, conjunctivae and secretions clear            Ears- Hearing, canals-normal            Nose- Clear, no-Septal dev, mucus, polyps, erosion, perforation             Throat- Mallampati II , mucosa clear , drainage- none, tonsils- atrophic Neck- flexible , trachea midline, no stridor , thyroid nl, carotid no bruit Chest - symmetrical excursion , unlabored           Heart/CV- RRR , no murmur , no gallop  , no rub, nl s1 s2                           - JVD- none , edema- none, stasis changes- none, varices- none           Lung- clear to P&A, wheeze- none, cough- none , dullness-none, rub- none           Chest wall-  Abd-  Br/ Gen/ Rectal- Not done, not indicated Extrem- cyanosis- none, clubbing, none, atrophy-  none, strength- nl Neuro- grossly intact to observation

## 2011-08-12 NOTE — Assessment & Plan Note (Signed)
Seasonal exacerbation. Plan-OTC antihistamines. Regular use of Flonase during peak season.

## 2011-08-12 NOTE — Progress Notes (Signed)
08/12/11 1909  Office Spirometry  FEV1 3.81 liters  FVC 4.96 liters  FEV1/FVC 76.8 %  FVC  % Predicted 99 liters  FEV % Predicted 98 liters  FeF 25-75 3.34 liters  FeF 25-75 % Predicted 94

## 2011-08-12 NOTE — Assessment & Plan Note (Signed)
Inadequate control with Symbicort 80. We are going to try Dulera 100 to see if a different steroid works any better for him, but he may need to return to Symbicort 160. Try Spiriva

## 2011-08-22 ENCOUNTER — Telehealth: Payer: Self-pay | Admitting: Internal Medicine

## 2011-08-22 MED ORDER — MOMETASONE FURO-FORMOTEROL FUM 100-5 MCG/ACT IN AERO: 2.0000 | INHALATION_SPRAY | Freq: Two times a day (BID) | RESPIRATORY_TRACT | Status: DC

## 2011-08-22 NOTE — Telephone Encounter (Signed)
I spoke with pt and he states he is doing better since starting the dulera 100 2 puffs BID. States this is better than the symbicort and can tell a difference in his breathing. Pt requesting 90 day supply [facility]. I advised pt will send rx and nothing further was needed

## 2012-01-25 ENCOUNTER — Telehealth: Payer: Self-pay | Admitting: Internal Medicine

## 2012-01-25 MED ORDER — ALBUTEROL SULFATE HFA 108 (90 BASE) MCG/ACT IN AERS: 2.0000 | INHALATION_SPRAY | Freq: Four times a day (QID) | RESPIRATORY_TRACT | Status: DC | PRN

## 2012-01-25 NOTE — Telephone Encounter (Signed)
Called and spoke with pt and he is aware that the rx for the albuterol has been sent to [facility] per his request.

## 2012-04-04 ENCOUNTER — Telehealth: Payer: Self-pay | Admitting: Internal Medicine

## 2012-04-04 MED ORDER — MOMETASONE FUROATE 50 MCG/ACT NA SUSP: 2.0000 | Freq: Every day | NASAL | Status: DC

## 2012-04-04 MED ORDER — FLUTICASONE PROPIONATE 50 MCG/ACT NA SUSP: 2.0000 | Freq: Every day | NASAL | Status: DC | PRN

## 2012-04-04 NOTE — Telephone Encounter (Signed)
Pt is requesting refills of the flonase and the nasonex.  Please advise CY if ok to send in both rx.  Thanks  Last ov 08/07/2011 Next ov  08/06/2012   No Known Allergies

## 2012-04-04 NOTE — Telephone Encounter (Signed)
Per CY-okay to refill both as requested.

## 2012-04-04 NOTE — Telephone Encounter (Signed)
rx for both meds have been sent to [facility] per pts request.   i called the pt and he is aware.  Pt was only requesting for the nasonex to be filled at this time.  This has been corrected and nothing further is needed.  `

## 2012-04-07 ENCOUNTER — Telehealth: Payer: Self-pay | Admitting: Internal Medicine

## 2012-04-07 NOTE — Telephone Encounter (Signed)
Leigh, msg states this was corrected on Friday.  Was this not the case?  Pls advise.  Thank you.

## 2012-04-07 NOTE — Telephone Encounter (Signed)
Need to call [facility] and cancel the rx for the flonase that was sent in on Friday.

## 2012-04-07 NOTE — Telephone Encounter (Signed)
Returning call.

## 2012-04-07 NOTE — Telephone Encounter (Signed)
i called to make the pt aware and he stated that he would rather have the flonase since this is cheaper.   He stated that the [facility] sent him an email and stated that the nasonex has been cancelled.   The flonase has already been shipped out.  i advised the pt that if he does receive the nasonex to call  The mailorder to see if they can take this back.  Pt very understanding and nothing further is needed.

## 2012-04-07 NOTE — Telephone Encounter (Signed)
i called [facility] and was told that the flonase has already been shipped out to the pt.   They stated that when the pt receives the shipment that he will need to call to get this returned to the  Pharmacy.    lmomtcb for pt to call me back to make him aware of this.

## 2012-04-07 NOTE — Telephone Encounter (Signed)
Leigh, msg was routed back to you and not closed.  Is there anything else that needs to be done with this msg?  Pls advise.  Thank you.

## 2012-04-18 NOTE — Telephone Encounter (Signed)
error 

## 2012-05-20 ENCOUNTER — Encounter: Payer: Self-pay | Admitting: Internal Medicine

## 2012-08-06 ENCOUNTER — Ambulatory Visit: Payer: Federal, State, Local not specified - PPO | Admitting: Internal Medicine

## 2012-08-07 ENCOUNTER — Encounter: Payer: Self-pay | Admitting: Internal Medicine

## 2012-08-07 ENCOUNTER — Ambulatory Visit (INDEPENDENT_AMBULATORY_CARE_PROVIDER_SITE_OTHER): Payer: Federal, State, Local not specified - PPO | Admitting: Internal Medicine

## 2012-08-07 VITALS — BP 122/82 | HR 44 | Ht 72.0 in | Wt 179.0 lb

## 2012-08-07 DIAGNOSIS — J45909 Unspecified asthma, uncomplicated: Secondary | ICD-10-CM

## 2012-08-07 DIAGNOSIS — J309 Allergic rhinitis, unspecified: Secondary | ICD-10-CM

## 2012-08-07 DIAGNOSIS — J302 Other seasonal allergic rhinitis: Secondary | ICD-10-CM

## 2012-08-07 DIAGNOSIS — J45998 Other asthma: Secondary | ICD-10-CM

## 2012-08-07 MED ORDER — AZELASTINE-FLUTICASONE 137-50 MCG/ACT NA SUSP: 1.0000 | Freq: Every day | NASAL | Status: DC

## 2012-08-07 NOTE — Progress Notes (Signed)
Subjective:    Patient ID: Robert Baker, male    DOB: 1951/03/21, 62 y.o.   MRN: [id number]  HPI 08/07/10- 60 yoM never smoker, followed for asthma and allergic rhinitis, complicated by DM. Last here- September 06, 2009 when he was noting some persistent cough despite Symbicort. Bradycardia was followed by [facility] cardiology and not an issue. . This Spring he has needed to use a rescue inhaler about twice weekly- ? Due to pollen. Eyes have itched more and has felt some throat congestion with a little morning phlegm. Not being wakened and little aware of wheeze.  He continues to be physically quite active.   08/07/11-60 yoM never smoker, followed for asthma and allergic rhinitis, complicated by DM. Chest congestion past month-unsure if related to pollen; Feels as he needs to go back up on Symbicort-having wheezing  He tried reducing Symbicort from 160-80, but had to increase Symbicort 80-4 puffs twice daily to keep asthma controlled. Failed Singulair years ago. Uses rescue inhaler about twice a week after increased exertion. Increased nasal congestion and rhinorrhea in the past month but does not use Flonase regularly.  08/07/12- 62 yoM never smoker, followed for asthma and allergic rhinitis, complicated by DM. FOLLOWS FOR: Increased congestion in throat area. Denies any SOb, wheezing, or cough at this time. Likes Dulera 100. Using rescue inhaler less than once per week. Some pollen rhinitis on Flonase.  ROS-see HPI Constitutional:   No-   weight loss, night sweats, fevers, chills, fatigue, lassitude. HEENT:   No-  headaches, difficulty swallowing, tooth/dental problems, sore throat,       +sneezing, itching, ear ache, +nasal congestion, post nasal drip,  CV:  No-   chest pain, orthopnea, PND, swelling in lower extremities, anasarca, dizziness, palpitations Resp: + shortness of breath with exertion or at rest.              No-   productive cough,  No non-productive cough,  No- coughing up of blood.           No-   change in color of mucus.  + wheezing.   Skin: No-   rash or lesions. GI:  No-   heartburn, indigestion, abdominal pain, nausea, vomiting, GU: . MS:  No-   joint pain or swelling.   Neuro-     nothing unusual Psych:  No- change in mood or affect. No depression or anxiety.  No memory loss.  OBJ- Physical Exam General- Alert, Oriented, Affect-appropriate, Distress- none acute Skin- rash-none, lesions- none, excoriation- none Lymphadenopathy- none Head- atraumatic            Eyes- Gross vision intact, PERRLA, conjunctivae and secretions clear            Ears- +Hearing aids            Nose- Clear, no-Septal dev, mucus, polyps, erosion, perforation             Throat- Mallampati II , mucosa clear , drainage- none, tonsils- atrophic Neck- flexible , trachea midline, no stridor , thyroid nl, carotid no bruit Chest - symmetrical excursion , unlabored           Heart/CV- RRR , no murmur , no gallop  , no rub, nl s1 s2                           - JVD- none , edema- none, stasis changes- none, varices- none  Lung- clear to P&A, wheeze- none, cough- none , dullness-none, rub- none           Chest wall-  Abd-  Br/ Gen/ Rectal- Not done, not indicated Extrem- cyanosis- none, clubbing, none, atrophy- none, strength- nl Neuro- grossly intact to observation

## 2012-08-07 NOTE — Patient Instructions (Addendum)
We can continue present meds  Sample Dymista nasal spray       Try 1-2 puffs each nostril once daily at bedtime       Ok to go back to your regular Flonase/ fluticasone when the sample is done

## 2012-08-18 ENCOUNTER — Encounter: Payer: Self-pay | Admitting: Internal Medicine

## 2012-08-18 NOTE — Assessment & Plan Note (Signed)
Rhinitis symptoms have been well-controlled with some impact from environmental humidity Plan-try Dymista nasal spray with discussion

## 2012-08-18 NOTE — Assessment & Plan Note (Signed)
Good medication control. He likes Dulera maintenance inhaler.

## 2013-01-29 ENCOUNTER — Other Ambulatory Visit: Payer: Self-pay

## 2013-05-08 ENCOUNTER — Telehealth: Payer: Self-pay | Admitting: Internal Medicine

## 2013-05-08 MED ORDER — MOMETASONE FURO-FORMOTEROL FUM 100-5 MCG/ACT IN AERO: 2.0000 | INHALATION_SPRAY | Freq: Two times a day (BID) | RESPIRATORY_TRACT | Status: DC

## 2013-05-08 NOTE — Telephone Encounter (Signed)
90-day supply Dulera 100 [facility] per pt request. Pt aware of upcoming appt 08/10/13 at 915 with CY (1 yr f/u)

## 2013-08-10 ENCOUNTER — Ambulatory Visit (INDEPENDENT_AMBULATORY_CARE_PROVIDER_SITE_OTHER): Payer: Federal, State, Local not specified - PPO | Admitting: Internal Medicine

## 2013-08-10 ENCOUNTER — Encounter: Payer: Self-pay | Admitting: Internal Medicine

## 2013-08-10 VITALS — BP 126/62 | HR 45 | Ht 72.0 in | Wt 178.0 lb

## 2013-08-10 DIAGNOSIS — J4520 Mild intermittent asthma, uncomplicated: Secondary | ICD-10-CM

## 2013-08-10 DIAGNOSIS — J309 Allergic rhinitis, unspecified: Secondary | ICD-10-CM

## 2013-08-10 DIAGNOSIS — J302 Other seasonal allergic rhinitis: Secondary | ICD-10-CM

## 2013-08-10 DIAGNOSIS — J45909 Unspecified asthma, uncomplicated: Secondary | ICD-10-CM

## 2013-08-10 NOTE — Patient Instructions (Signed)
Consider trying saline nasal gel for dry nose and protection from nose bleeds  Call for inhalers as needed

## 2013-08-10 NOTE — Progress Notes (Signed)
Subjective:    Patient ID: Robert GeeJames Baker, male    DOB: 1950-10-28, 63 y.o.   MRN: [id number]  HPI 08/07/10- 60 yoM never smoker, followed for asthma and allergic rhinitis, complicated by DM. Last here- September 06, 2009 when he was noting some persistent cough despite Symbicort. Bradycardia was [facility] by [facility]ology and not an issue. . This Spring he has needed to use a rescue inhaler about twice weekly- ? Due to pollen. Eyes have itched more and has felt some throat congestion with a little morning phlegm. Not being wakened and little aware of wheeze.  He continues to be physically quite active.   08/07/11-60 yoM never smoker, followed for asthma and allergic rhinitis, complicated by DM. Chest congestion past month-unsure if related to pollen; Feels as he needs to go back up on Symbicort-having wheezing  He tried reducing Symbicort from 160-80, but had to increase Symbicort 80-4 puffs twice daily to keep asthma controlled. Failed Singulair years ago. Uses rescue inhaler about twice a week after increased exertion. Increased nasal congestion and rhinorrhea in the past month but does not use Flonase regularly.  08/07/12- 62 yoM never smoker, followed for asthma and allergic rhinitis, complicated by DM. FOLLOWS FOR: Increased congestion in throat area. Denies any SOb, wheezing, or cough at this time. Likes Dulera 100. Using rescue inhaler less than once per week. Some pollen rhinitis on Flonase.  08/10/13- 63 yoM never smoker, followed for asthma and allergic rhinitis, complicated by DM. FOLLOWS FOR: congestion caught in throat area alot lately. Has sneezing and/or cough when outside and pollen count is high or mowing yard. Found saline nasal rinse helpful in the past. Using Flonase intermittently because steady use causes nosebleeds.  ROS-see HPI Constitutional:   No-   weight loss, night sweats, fevers, chills, fatigue, lassitude. HEENT:   No-  headaches, difficulty swallowing, tooth/dental  problems, sore throat,       +sneezing, itching, ear ache, +nasal congestion, +post nasal drip,  CV:  No-   chest pain, orthopnea, PND, swelling in lower extremities, anasarca, dizziness, palpitations Resp: + shortness of breath with exertion or at rest.              No-   productive cough,  No non-productive cough,  No- coughing up of blood.              No-   change in color of mucus.  + wheezing.   Skin: No-   rash or lesions. GI:  No-   heartburn, indigestion, abdominal pain, nausea, vomiting, GU: . MS:  No-   joint pain or swelling.   Neuro-     nothing unusual Psych:  No- change in mood or affect. No depression or anxiety.  No memory loss.  OBJ- Physical Exam General- Alert, Oriented, Affect-appropriate, Distress- none acute Skin- rash-none, lesions- none, excoriation- none Lymphadenopathy- none Head- atraumatic            Eyes- Gross vision intact, PERRLA, conjunctivae and secretions clear            Ears- +Hearing aids            Nose- Clear, no-Septal dev, mucus, polyps, erosion, perforation             Throat- Mallampati II , mucosa clear , drainage- none, tonsils- atrophic Neck- flexible , trachea midline, no stridor , thyroid nl, carotid no bruit Chest - symmetrical excursion , unlabored           Heart/CV-  RRR , no murmur , no gallop  , no rub, nl s1 s2                           - JVD- none , edema- none, stasis changes- none, varices- none           Lung- clear to P&A, wheeze- none, cough- none , dullness-none, rub- none           Chest wall-  Abd-  Br/ Gen/ Rectal- Not done, not indicated Extrem- cyanosis- none, clubbing, none, atrophy- none, strength- nl Neuro- grossly intact to observation

## 2013-09-26 NOTE — Assessment & Plan Note (Signed)
Currently good control 

## 2013-09-26 NOTE — Assessment & Plan Note (Signed)
Spring pollen exacerbation Plan-educated use a steroid nasal sprays, saline nasal gel, saline rinse/Neti pot

## 2013-12-24 DIAGNOSIS — M75120 Complete rotator cuff tear or rupture of unspecified shoulder, not specified as traumatic: Secondary | ICD-10-CM

## 2013-12-24 DIAGNOSIS — M24111 Other articular cartilage disorders, right shoulder: Secondary | ICD-10-CM

## 2013-12-24 DIAGNOSIS — M7521 Bicipital tendinitis, right shoulder: Secondary | ICD-10-CM

## 2013-12-24 HISTORY — DX: Bicipital tendinitis, right shoulder: M75.21

## 2013-12-24 HISTORY — DX: Complete rotator cuff tear or rupture of unspecified shoulder, not specified as traumatic: M75.120

## 2013-12-24 HISTORY — DX: Other articular cartilage disorders, right shoulder: M24.111

## 2013-12-31 ENCOUNTER — Ambulatory Visit (INDEPENDENT_AMBULATORY_CARE_PROVIDER_SITE_OTHER): Payer: Federal, State, Local not specified - PPO | Admitting: Podiatrist

## 2013-12-31 ENCOUNTER — Encounter: Payer: Self-pay | Admitting: Podiatrist

## 2013-12-31 VITALS — BP 126/76 | HR 77 | Resp 13 | Ht 71.0 in | Wt 175.0 lb

## 2013-12-31 DIAGNOSIS — E1140 Type 2 diabetes mellitus with diabetic neuropathy, unspecified: Secondary | ICD-10-CM

## 2013-12-31 DIAGNOSIS — M216X9 Other acquired deformities of unspecified foot: Secondary | ICD-10-CM

## 2013-12-31 DIAGNOSIS — Q828 Other specified congenital malformations of skin: Secondary | ICD-10-CM

## 2013-12-31 NOTE — Progress Notes (Signed)
   Subjective:    Patient ID: Robert Baker, Robert Baker    DOB: [birth year], 63 y.o.   MRN: [id number]  HPI Comments: Pt presents for diabetic foot exam and debridement of callouses to B/L 1st MPJ, B/L 5th metatarsals proximally, and left medial arch plantar.     Review of Systems  Skin:       Finger and toenails are dry and brittle.  All other systems reviewed and are negative.      Objective:   Physical Exam Patient is awake, alert, and oriented x 3.  In no acute distress.  Vascular status is intact with palpable pedal pulses at 2/4 DP and PT bilateral and capillary refill time within normal limits. Neurological sensation is also intact bilaterally via Semmes Weinstein monofilament at 4/5 sites- loss of sensation at the tips of bilateral halluces noted. Light touch, vibratory sensation, Achilles tendon reflex is intact. Dermatological exam reveals a hyperkeratotic lesion at the navicular of the left foot and lateral plantar left foot.  Right foot has a porokeratotic lesion laterally.  No open lesions present.  Musculature reveals pes planus deformity.       Assessment & Plan:  Diabetes, early neuropathy, porokeratotic lesions, pes planus  Plan:  Debridement of lesions accomplished today without compllications.  Recommended an accomidative type 3d orthotic with a deep heel seat for his hiking shoes.  Was scanned today and will double check with insurance to see about coverage and benefits as he recently had another pair made.

## 2013-12-31 NOTE — Patient Instructions (Signed)
Diabetes and Foot Care Diabetes may cause you to have problems because of poor blood supply (circulation) to your feet and legs. This may cause the skin on your feet to become thinner, break easier, and heal more slowly. Your skin may become dry, and the skin may peel and crack. You may also have nerve damage in your legs and feet causing decreased feeling in them. You may not notice minor injuries to your feet that could lead to infections or more serious problems. Taking care of your feet is one of the most important things you can do for yourself.  HOME CARE INSTRUCTIONS  Wear shoes at all times, even in the house. Do not go barefoot. Bare feet are easily injured.  Check your feet daily for blisters, cuts, and redness. If you cannot see the bottom of your feet, use a mirror or ask someone for help.  Wash your feet with warm water (do not use hot water) and mild soap. Then pat your feet and the areas between your toes until they are completely dry. Do not soak your feet as this can dry your skin.  Apply a moisturizing lotion or petroleum jelly (that does not contain alcohol and is unscented) to the skin on your feet and to dry, brittle toenails. Do not apply lotion between your toes.  Trim your toenails straight across. Do not dig under them or around the cuticle. File the edges of your nails with an emery board or nail file.  Do not cut corns or calluses or try to remove them with medicine.  Wear clean socks or stockings every day. Make sure they are not too tight. Do not wear knee-high stockings since they may decrease blood flow to your legs.  Wear shoes that fit properly and have enough cushioning. To break in new shoes, wear them for just a few hours a day. This prevents you from injuring your feet. Always look in your shoes before you put them on to be sure there are no objects inside.  Do not cross your legs. This may decrease the blood flow to your feet.  If you find a minor scrape,  cut, or break in the skin on your feet, keep it and the skin around it clean and dry. These areas may be cleansed with mild soap and water. Do not cleanse the area with peroxide, alcohol, or iodine.  When you remove an adhesive bandage, be sure not to damage the skin around it.  If you have a wound, look at it several times a day to make sure it is healing.  Do not use heating pads or hot water bottles. They may burn your skin. If you have lost feeling in your feet or legs, you may not know it is happening until it is too late.  Make sure your health care provider performs a complete foot exam at least annually or more often if you have foot problems. Report any cuts, sores, or bruises to your health care provider immediately. SEEK MEDICAL CARE IF:   You have an injury that is not healing.  You have cuts or breaks in the skin.  You have an ingrown nail.  You notice redness on your legs or feet.  You feel burning or tingling in your legs or feet.  You have pain or cramps in your legs and feet.  Your legs or feet are numb.  Your feet always feel cold. SEEK IMMEDIATE MEDICAL CARE IF:   There is increasing redness,   swelling, or pain in or around a wound.  There is a red line that goes up your leg.  Pus is coming from a wound.  You develop a fever or as directed by your health care provider.  You notice a bad smell coming from an ulcer or wound. Document Released: 03/09/2000 Document Revised: 11/12/2012 Document Reviewed: 08/19/2012 [employer] Patient Information 2015 [employer]. This information is not intended to replace advice given to you by your health care provider. Make sure you discuss any questions you have with your health care provider.  

## 2014-01-01 ENCOUNTER — Telehealth: Payer: Self-pay | Admitting: *Deleted

## 2014-01-01 NOTE — Telephone Encounter (Signed)
I saw Dr. Irving ShowsEgerton yesterday.  We talked about ordering shoe inserts.  I wanted to double check with [employer].  I've  Confirmed that it is safe to go ahead and order those.  So I want to go ahead and do that.  My feet were scanned yesterday.  I told him I got the message and we will get the orthotics ordered.  We will send you a postcard when they come in asking you to schedule an appointment.  He stated, "Oh okay, sounds good.  Dr. Irving ShowsEgerton said that if I have any problems with them the company will make any changes that need to be done and it's guaranteed.  I told him they will make any necessary changes.  He stated, "Thanks for calling."

## 2014-01-04 ENCOUNTER — Other Ambulatory Visit: Payer: Self-pay | Admitting: Physician Assistant

## 2014-01-07 ENCOUNTER — Encounter (HOSPITAL_BASED_OUTPATIENT_CLINIC_OR_DEPARTMENT_OTHER): Payer: Self-pay | Admitting: *Deleted

## 2014-01-07 DIAGNOSIS — R0981 Nasal congestion: Secondary | ICD-10-CM

## 2014-01-07 HISTORY — DX: Nasal congestion: R09.81

## 2014-01-07 NOTE — Pre-Procedure Instructions (Addendum)
To come for BMET and EKG Office notes req. from Dr. Marland McalpineWebb's office (nephrology)

## 2014-01-08 ENCOUNTER — Other Ambulatory Visit: Payer: Self-pay

## 2014-01-11 ENCOUNTER — Encounter (HOSPITAL_BASED_OUTPATIENT_CLINIC_OR_DEPARTMENT_OTHER)
Admission: RE | Admit: 2014-01-11 | Discharge: 2014-01-11 | Disposition: A | Discharge status: Home / Self Care | Payer: Federal, State, Local not specified - PPO | Source: Ambulatory Visit | Attending: Orthopedic Surgery | Admitting: Orthopedic Surgery

## 2014-01-11 DIAGNOSIS — M24011 Loose body in right shoulder: Secondary | ICD-10-CM | POA: Diagnosis not present

## 2014-01-11 DIAGNOSIS — E109 Type 1 diabetes mellitus without complications: Secondary | ICD-10-CM | POA: Diagnosis not present

## 2014-01-11 DIAGNOSIS — M75121 Complete rotator cuff tear or rupture of right shoulder, not specified as traumatic: Secondary | ICD-10-CM | POA: Diagnosis not present

## 2014-01-11 DIAGNOSIS — M7591 Shoulder lesion, unspecified, right shoulder: Secondary | ICD-10-CM | POA: Diagnosis not present

## 2014-01-11 DIAGNOSIS — Z794 Long term (current) use of insulin: Secondary | ICD-10-CM | POA: Diagnosis not present

## 2014-01-11 DIAGNOSIS — J45909 Unspecified asthma, uncomplicated: Secondary | ICD-10-CM | POA: Diagnosis not present

## 2014-01-11 LAB — BASIC METABOLIC PANEL
ANION GAP: 11 (ref 5–15)
BUN: 30 mg/dL — AB (ref 6–23)
CALCIUM: 9.4 mg/dL (ref 8.4–10.5)
CHLORIDE: 105 meq/L (ref 96–112)
CO2: 25 meq/L (ref 19–32)
Creatinine, Ser: 1.44 mg/dL — ABNORMAL HIGH (ref 0.50–1.35)
GFR calc non Af Amer: 50 mL/min — ABNORMAL LOW (ref >90)
GFR, EST AFRICAN AMERICAN: 58 mL/min — AB (ref >90)
Glucose, Bld: 136 mg/dL — ABNORMAL HIGH (ref 70–99)
Potassium: 5 mEq/L (ref 3.7–5.3)
Sodium: 141 mEq/L (ref 137–147)

## 2014-01-13 NOTE — H&P (Signed)
Estalene Bergey/[facility] [address]   [address] [place], [place] [id number] [phone] A Division of [facility]  Loreta Aveaniel F. Marketta Valadez, M.D.   Robert A. Thurston HoleWainer, M.D.   Burnell BlanksW. Dan Caffrey, M.D.   Eulas PostJoshua P. Landau, M.D.   Lunette StandsAnna Voytek, M.D Jewel Baizeimothy D. Eulah PontMurphy, M.D.  Buford DresserWesley R. Ibazebo, M.D.  Estell HarpinJames S. Kramer, M.D.    Melina Fiddlerebecca S. Bassett, M.D. Mary L. Isidoro DonningAnton, PA-C  Kirstin A. Shepperson, PA-C  Josh Tuttletownhadwell, PA-C [place] Par[place], [place]C   RE: Allyne GeeGray, Johnmatthew   [phone]      DOB: Aug 07, 1950 PROGRESS NOTE: 11-17-13 Robert Baker is seen in consultation. Old patient of mine. Right shoulder. Relatively sudden issues of pain throwing a basketball more than 6 months ago. He has not gotten better. I reviewed therapy workup and treatment to date. Recently completed MRI shows a full thickness tear supraspinatus tendon anterior 2/3. Still looks very reparable. Previous arthroscopy with type I acromion. Distal clavicle excision with a little bone fragments in the resection area. There is also what appears to be a calcific density posteriorly in the shoulder but I think this is well medial to the glenoid. Although they describe this as an extensive partial thickness tear it is essentially full thickness by my viewing. He has symptoms to go along with this.  I treated both shoulders in the 1990's. On the left decompression mini open rotator cuff repair doing well. On the right arthroscopic decompression acromioplasty and distal clavicle excision no rotator cuff repair. That did great until this event 6 months ago. Mild flare of some symptoms treated with injection in 2008 resolved. Remaining history general exam is outlined included in the chart.  EXAMINATION: I can get both shoulders through full motion. On the left great strength no impingement signs. On the right positive impingement positive palms down abduction. Lacks a little internal rotation. No demonstrable atrophy. No  neurovascular compromise.  DISPOSITION: We discussed definitive treatment on the right. He is convinced as I am that it's not going to improve conservatively. Discussed exam under anesthesia arthroscopy revision acromioplasty distal clavicle excision. Arthroscopically assisted rotator cuff repair. I will have a mini C-arm available if it looks like this ossification is in the cuff in the back but after reviewing all his films I don't think that's going to be an issue. What to expect intra and post-op reviewed. More than 25 minutes spent face-to-face covering all this with him. He understands and agrees.  Loreta Aveaniel F. Donis Pinder, M.D.  Electronically verified by Loreta Aveaniel F. Rhena Glace, M.D. DFM:kah D 11-17-13 T 11-18-13 Kenith Trickel/[facility] [address]   [address] [place], [place] [phone] A Division of [facility]  Loreta Aveaniel F. Uri Turnbough, M.D.   Robert A. Thurston HoleWainer, M.D.   Burnell BlanksW. Dan Caffrey, M.D.   Eulas PostJoshua P. Landau, M.D.   Lunette StandsAnna Voytek, M.D Jewel Baizeimothy D. Eulah PontMurphy, M.D.  Buford DresserWesley R. Ibazebo, M.D.  Estell HarpinJames S. Kramer, M.D.    Melina Fiddlerebecca S. Bassett, M.D. Mary L. Isidoro DonningAnton, PA-C  Kirstin A. Shepperson, PA-C  Josh Pawneehadwell, PA-C [place], [place]   RE: Allyne GeeGray, Othal                                [phone]      DOB: Aug 07, 1950 PROGRESS NOTE: 12-29-13 Robert Baker came in earlier than scheduled.  He is scheduled for a right shoulder rotator cuff repair by me on October 22nd.  We  have confirmed a rotator cuff tear and he knows this needs to be fixed.  We have put this off a little bit because of family events.  His daughter is getting married on October 17th.  He has asked if we could re-inject him today to try to give him a little bit of comfort until we can get to his repair.  Symptoms unchanged.   History and general exam is outlined and included in the chart.   DISPOSITION:  I think it is reasonable to inject him as we are scheduled for surgery.  He knows this is a very  short-term cure.    PROCEDURE NOTE: The patient's clinical condition is marked by substantial pain and/or significant functional disability.  Other conservative therapy has not provided relief, is contraindicated, or not appropriate.  There is a reasonable likelihood that injection will significantly improve the patient's pain and/or functional disability. After appropriate consent and under sterile technique subacromial injection of the right shoulder with Depo-Medrol/Marcaine.  Tolerated this well.    Loreta Aveaniel F. Lenaya Pietsch, M.D.   Electronically verified by Loreta Aveaniel F. Rafiel Mecca, M.D. DFM:jjh D 12-29-13 T 12-30-13

## 2014-01-14 ENCOUNTER — Encounter (HOSPITAL_BASED_OUTPATIENT_CLINIC_OR_DEPARTMENT_OTHER): Payer: Federal, State, Local not specified - PPO | Admitting: Anesthesiology

## 2014-01-14 ENCOUNTER — Ambulatory Visit (HOSPITAL_BASED_OUTPATIENT_CLINIC_OR_DEPARTMENT_OTHER)
Admission: RE | Admit: 2014-01-14 | Discharge: 2014-01-14 | Disposition: A | Discharge status: Home / Self Care | Payer: Federal, State, Local not specified - PPO | Source: Ambulatory Visit | Attending: Orthopedic Surgery | Admitting: Orthopedic Surgery

## 2014-01-14 ENCOUNTER — Encounter (HOSPITAL_BASED_OUTPATIENT_CLINIC_OR_DEPARTMENT_OTHER)
Admission: RE | Disposition: A | Discharge status: Home / Self Care | Payer: Self-pay | Source: Ambulatory Visit | Attending: Orthopedic Surgery

## 2014-01-14 ENCOUNTER — Ambulatory Visit (HOSPITAL_BASED_OUTPATIENT_CLINIC_OR_DEPARTMENT_OTHER): Payer: Federal, State, Local not specified - PPO | Admitting: Anesthesiology

## 2014-01-14 ENCOUNTER — Encounter (HOSPITAL_BASED_OUTPATIENT_CLINIC_OR_DEPARTMENT_OTHER): Payer: Self-pay | Admitting: Anesthesiology

## 2014-01-14 DIAGNOSIS — M75121 Complete rotator cuff tear or rupture of right shoulder, not specified as traumatic: Secondary | ICD-10-CM | POA: Diagnosis not present

## 2014-01-14 DIAGNOSIS — Z794 Long term (current) use of insulin: Secondary | ICD-10-CM | POA: Insufficient documentation

## 2014-01-14 DIAGNOSIS — E109 Type 1 diabetes mellitus without complications: Secondary | ICD-10-CM | POA: Insufficient documentation

## 2014-01-14 DIAGNOSIS — J45909 Unspecified asthma, uncomplicated: Secondary | ICD-10-CM | POA: Insufficient documentation

## 2014-01-14 DIAGNOSIS — M7591 Shoulder lesion, unspecified, right shoulder: Secondary | ICD-10-CM | POA: Insufficient documentation

## 2014-01-14 DIAGNOSIS — M24011 Loose body in right shoulder: Secondary | ICD-10-CM | POA: Insufficient documentation

## 2014-01-14 HISTORY — DX: Unspecified cataract: H26.9

## 2014-01-14 HISTORY — DX: Pure hypercholesterolemia, unspecified: E78.00

## 2014-01-14 HISTORY — DX: Nasal congestion: R09.81

## 2014-01-14 HISTORY — DX: Unspecified osteoarthritis, unspecified site: M19.90

## 2014-01-14 HISTORY — DX: Other atrophic disorders of skin: L90.8

## 2014-01-14 HISTORY — DX: Long term (current) use of insulin: Z79.4

## 2014-01-14 HISTORY — DX: Unspecified asthma, uncomplicated: J45.909

## 2014-01-14 HISTORY — DX: Reserved for inherently not codable concepts without codable children: IMO0001

## 2014-01-14 HISTORY — DX: Personal history of other diseases of urinary system: Z87.448

## 2014-01-14 HISTORY — PX: SHOULDER ARTHROSCOPY WITH SUBACROMIAL DECOMPRESSION, ROTATOR CUFF REPAIR AND BICEP TENDON REPAIR: SHX5687

## 2014-01-14 HISTORY — DX: Dental restoration status: Z98.811

## 2014-01-14 HISTORY — DX: Bicipital tendinitis, right shoulder: M75.21

## 2014-01-14 HISTORY — DX: Other articular cartilage disorders, right shoulder: M24.111

## 2014-01-14 HISTORY — DX: Type 2 diabetes mellitus without complications: E11.9

## 2014-01-14 HISTORY — DX: Complete rotator cuff tear or rupture of unspecified shoulder, not specified as traumatic: M75.120

## 2014-01-14 LAB — GLUCOSE, CAPILLARY
Glucose-Capillary: 149 mg/dL — ABNORMAL HIGH (ref 70–99)
Glucose-Capillary: 173 mg/dL — ABNORMAL HIGH (ref 70–99)

## 2014-01-14 LAB — POCT HEMOGLOBIN-HEMACUE: HEMOGLOBIN: 12 g/dL — AB (ref 13.0–17.0)

## 2014-01-14 SURGERY — SHOULDER ARTHROSCOPY WITH SUBACROMIAL DECOMPRESSION, ROTATOR CUFF REPAIR AND BICEP TENDON REPAIR
Anesthesia: Regional | Site: Shoulder | Laterality: Right

## 2014-01-14 MED ORDER — EPHEDRINE SULFATE 50 MG/ML IJ SOLN
INTRAMUSCULAR | Status: DC | PRN
  Administered 2014-01-14: 10 mg via INTRAVENOUS

## 2014-01-14 MED ORDER — SUCCINYLCHOLINE CHLORIDE 20 MG/ML IJ SOLN
INTRAMUSCULAR | Status: DC | PRN
  Administered 2014-01-14: 50 mg via INTRAVENOUS

## 2014-01-14 MED ORDER — CEFAZOLIN SODIUM-DEXTROSE 2-3 GM-% IV SOLR: 2.0000 g | INTRAVENOUS | Status: DC

## 2014-01-14 MED ORDER — METHOCARBAMOL 500 MG PO TABS: 500.0000 mg | ORAL_TABLET | Freq: Four times a day (QID) | ORAL | Status: DC | PRN

## 2014-01-14 MED ORDER — FENTANYL CITRATE 0.05 MG/ML IJ SOLN
INTRAMUSCULAR | Status: DC | PRN
  Administered 2014-01-14 (×2): 50 ug via INTRAVENOUS

## 2014-01-14 MED ORDER — DEXAMETHASONE SODIUM PHOSPHATE 4 MG/ML IJ SOLN
INTRAMUSCULAR | Status: DC | PRN
  Administered 2014-01-14: 10 mg via INTRAVENOUS

## 2014-01-14 MED ORDER — METOCLOPRAMIDE HCL 5 MG/ML IJ SOLN: 5.0000 mg | Freq: Three times a day (TID) | INTRAMUSCULAR | Status: DC | PRN

## 2014-01-14 MED ORDER — BUPIVACAINE-EPINEPHRINE (PF) 0.5% -1:200000 IJ SOLN
INTRAMUSCULAR | Status: DC | PRN
  Administered 2014-01-14: 25 mL via PERINEURAL

## 2014-01-14 MED ORDER — OXYCODONE HCL 5 MG/5ML PO SOLN: 5.0000 mg | Freq: Once | ORAL | Status: DC | PRN

## 2014-01-14 MED ORDER — PROPOFOL 10 MG/ML IV BOLUS
INTRAVENOUS | Status: DC | PRN
  Administered 2014-01-14: 200 mg via INTRAVENOUS

## 2014-01-14 MED ORDER — MIDAZOLAM HCL 5 MG/5ML IJ SOLN
INTRAMUSCULAR | Status: DC | PRN
  Administered 2014-01-14: 2 mg via INTRAVENOUS

## 2014-01-14 MED ORDER — FENTANYL CITRATE 0.05 MG/ML IJ SOLN
50.0000 ug | INTRAMUSCULAR | Status: DC | PRN
  Administered 2014-01-14: 100 ug via INTRAVENOUS

## 2014-01-14 MED ORDER — CEFAZOLIN SODIUM-DEXTROSE 2-3 GM-% IV SOLR
INTRAVENOUS | Status: AC
  Filled 2014-01-14: qty 50

## 2014-01-14 MED ORDER — METOCLOPRAMIDE HCL 5 MG PO TABS: 5.0000 mg | ORAL_TABLET | Freq: Three times a day (TID) | ORAL | Status: DC | PRN

## 2014-01-14 MED ORDER — FENTANYL CITRATE 0.05 MG/ML IJ SOLN
INTRAMUSCULAR | Status: AC
  Filled 2014-01-14: qty 6

## 2014-01-14 MED ORDER — FENTANYL CITRATE 0.05 MG/ML IJ SOLN
INTRAMUSCULAR | Status: AC
  Filled 2014-01-14: qty 2

## 2014-01-14 MED ORDER — OXYCODONE HCL 5 MG PO TABS: 5.0000 mg | ORAL_TABLET | ORAL | Status: DC | PRN

## 2014-01-14 MED ORDER — ALBUTEROL SULFATE HFA 108 (90 BASE) MCG/ACT IN AERS
INHALATION_SPRAY | RESPIRATORY_TRACT | Status: DC | PRN
  Administered 2014-01-14 (×2): 2 via RESPIRATORY_TRACT

## 2014-01-14 MED ORDER — OXYCODONE-ACETAMINOPHEN 5-325 MG PO TABS: 1.0000 | ORAL_TABLET | ORAL | Status: DC | PRN

## 2014-01-14 MED ORDER — MIDAZOLAM HCL 2 MG/2ML IJ SOLN
1.0000 mg | INTRAMUSCULAR | Status: DC | PRN
  Administered 2014-01-14: 1 mg via INTRAVENOUS

## 2014-01-14 MED ORDER — LACTATED RINGERS IV SOLN
INTRAVENOUS | Status: DC
  Administered 2014-01-14 (×2): via INTRAVENOUS

## 2014-01-14 MED ORDER — CEFAZOLIN SODIUM-DEXTROSE 2-3 GM-% IV SOLR
INTRAVENOUS | Status: DC | PRN
  Administered 2014-01-14: 2 g via INTRAVENOUS

## 2014-01-14 MED ORDER — MIDAZOLAM HCL 2 MG/ML PO SYRP: 12.0000 mg | ORAL_SOLUTION | Freq: Once | ORAL | Status: DC | PRN

## 2014-01-14 MED ORDER — MIDAZOLAM HCL 2 MG/2ML IJ SOLN
INTRAMUSCULAR | Status: AC
  Filled 2014-01-14: qty 2

## 2014-01-14 MED ORDER — ONDANSETRON HCL 4 MG PO TABS: 4.0000 mg | ORAL_TABLET | Freq: Four times a day (QID) | ORAL | Status: DC | PRN

## 2014-01-14 MED ORDER — CHLORHEXIDINE GLUCONATE 4 % EX LIQD: 60.0000 mL | Freq: Once | CUTANEOUS | Status: DC

## 2014-01-14 MED ORDER — LIDOCAINE HCL (CARDIAC) 20 MG/ML IV SOLN
INTRAVENOUS | Status: DC | PRN
  Administered 2014-01-14: 50 mg via INTRAVENOUS

## 2014-01-14 MED ORDER — OXYCODONE HCL 5 MG PO TABS: 5.0000 mg | ORAL_TABLET | Freq: Once | ORAL | Status: DC | PRN

## 2014-01-14 MED ORDER — ONDANSETRON HCL 4 MG PO TABS: 4.0000 mg | ORAL_TABLET | Freq: Three times a day (TID) | ORAL | Status: DC | PRN

## 2014-01-14 MED ORDER — METHOCARBAMOL 1000 MG/10ML IJ SOLN: 500.0000 mg | Freq: Four times a day (QID) | INTRAVENOUS | Status: DC | PRN

## 2014-01-14 MED ORDER — ONDANSETRON HCL 4 MG/2ML IJ SOLN
INTRAMUSCULAR | Status: DC | PRN
  Administered 2014-01-14: 4 mg via INTRAVENOUS

## 2014-01-14 MED ORDER — HYDROMORPHONE HCL 1 MG/ML IJ SOLN: 0.5000 mg | INTRAMUSCULAR | Status: DC | PRN

## 2014-01-14 MED ORDER — HYDROMORPHONE HCL 1 MG/ML IJ SOLN: 0.2500 mg | INTRAMUSCULAR | Status: DC | PRN

## 2014-01-14 MED ORDER — LACTATED RINGERS IV SOLN
INTRAVENOUS | Status: DC
  Administered 2014-01-14: 08:00:00 via INTRAVENOUS

## 2014-01-14 MED ORDER — ONDANSETRON HCL 4 MG/2ML IJ SOLN: 4.0000 mg | Freq: Four times a day (QID) | INTRAMUSCULAR | Status: DC | PRN

## 2014-01-14 SURGICAL SUPPLY — 75 items
ANCH SUT SWLK 19.1X5.5 CLS EL (Anchor) ×2 IMPLANT
ANCHOR PEEK SWIVEL LOCK 5.5 (Anchor) ×4 IMPLANT
APL SKNCLS STERI-STRIP NONHPOA (GAUZE/BANDAGES/DRESSINGS)
BENZOIN TINCTURE PRP APPL 2/3 (GAUZE/BANDAGES/DRESSINGS) IMPLANT
BLADE 11 SAFETY STRL DISP (BLADE) ×3 IMPLANT
BLADE CUTTER GATOR 3.5 (BLADE) ×3 IMPLANT
BLADE CUTTER MENIS 5.5 (BLADE) IMPLANT
BLADE GREAT WHITE 4.2 (BLADE) ×2 IMPLANT
BLADE GREAT WHITE 4.2MM (BLADE) ×1
BLADE SURG 15 STRL LF DISP TIS (BLADE) ×1 IMPLANT
BLADE SURG 15 STRL SS (BLADE) ×3
BUR OVAL 6.0 (BURR) ×3 IMPLANT
CANISTER SUCT 3000ML (MISCELLANEOUS) IMPLANT
CANNULA DRY DOC 8X75 (CANNULA) ×2 IMPLANT
CANNULA TWIST IN 8.25X7CM (CANNULA) IMPLANT
CLOSURE WOUND 1/2 X4 (GAUZE/BANDAGES/DRESSINGS)
DECANTER SPIKE VIAL GLASS SM (MISCELLANEOUS) IMPLANT
DRAPE STERI 35X30 U-POUCH (DRAPES) ×3 IMPLANT
DRAPE U-SHAPE 47X51 STRL (DRAPES) ×3 IMPLANT
DRAPE U-SHAPE 76X120 STRL (DRAPES) ×6 IMPLANT
DRSG PAD ABDOMINAL 8X10 ST (GAUZE/BANDAGES/DRESSINGS) ×3 IMPLANT
DURAPREP 26ML APPLICATOR (WOUND CARE) ×3 IMPLANT
ELECT MENISCUS 165MM 90D (ELECTRODE) ×3 IMPLANT
ELECT NDL TIP 2.8 STRL (NEEDLE) IMPLANT
ELECT NEEDLE TIP 2.8 STRL (NEEDLE) IMPLANT
ELECT REM PT RETURN 9FT ADLT (ELECTROSURGICAL) ×3
ELECTRODE REM PT RTRN 9FT ADLT (ELECTROSURGICAL) ×1 IMPLANT
GAUZE SPONGE 4X4 12PLY STRL (GAUZE/BANDAGES/DRESSINGS) ×6 IMPLANT
GAUZE XEROFORM 1X8 LF (GAUZE/BANDAGES/DRESSINGS) ×3 IMPLANT
GLOVE BIOGEL PI IND STRL 7.0 (GLOVE) ×1 IMPLANT
GLOVE BIOGEL PI INDICATOR 7.0 (GLOVE) ×2
GLOVE ECLIPSE 6.5 STRL STRAW (GLOVE) ×5 IMPLANT
GLOVE ORTHO TXT STRL SZ7.5 (GLOVE) ×3 IMPLANT
GOWN STRL REUS W/ TWL LRG LVL3 (GOWN DISPOSABLE) ×2 IMPLANT
GOWN STRL REUS W/ TWL XL LVL3 (GOWN DISPOSABLE) ×1 IMPLANT
GOWN STRL REUS W/TWL LRG LVL3 (GOWN DISPOSABLE) ×6
GOWN STRL REUS W/TWL XL LVL3 (GOWN DISPOSABLE) ×3
IV NS IRRIG 3000ML ARTHROMATIC (IV SOLUTION) ×12 IMPLANT
MANIFOLD NEPTUNE II (INSTRUMENTS) ×3 IMPLANT
NDL SCORPION MULTI FIRE (NEEDLE) IMPLANT
NDL SUT 6 .5 CRC .975X.05 MAYO (NEEDLE) IMPLANT
NEEDLE MAYO TAPER (NEEDLE)
NEEDLE SCORPION MULTI FIRE (NEEDLE) ×3 IMPLANT
NS IRRIG 1000ML POUR BTL (IV SOLUTION) IMPLANT
PACK ARTHROSCOPY DSU (CUSTOM PROCEDURE TRAY) ×3 IMPLANT
PACK BASIN DAY SURGERY FS (CUSTOM PROCEDURE TRAY) ×3 IMPLANT
PASSER SUT SWANSON 36MM LOOP (INSTRUMENTS) IMPLANT
PENCIL BUTTON HOLSTER BLD 10FT (ELECTRODE) ×3 IMPLANT
SET ARTHROSCOPY TUBING (MISCELLANEOUS) ×3
SET ARTHROSCOPY TUBING LN (MISCELLANEOUS) ×1 IMPLANT
SLEEVE SCD COMPRESS KNEE MED (MISCELLANEOUS) ×2 IMPLANT
SLING ARM IMMOBILIZER LRG (SOFTGOODS) IMPLANT
SLING ARM IMMOBILIZER MED (SOFTGOODS) IMPLANT
SLING ARM LRG ADULT FOAM STRAP (SOFTGOODS) IMPLANT
SLING ARM MED ADULT FOAM STRAP (SOFTGOODS) IMPLANT
SLING ARM XL FOAM STRAP (SOFTGOODS) IMPLANT
SPONGE LAP 4X18 X RAY DECT (DISPOSABLE) IMPLANT
STRIP CLOSURE SKIN 1/2X4 (GAUZE/BANDAGES/DRESSINGS) IMPLANT
SUCTION FRAZIER TIP 10 FR DISP (SUCTIONS) IMPLANT
SUT ETHIBOND 2 OS 4 DA (SUTURE) IMPLANT
SUT ETHILON 2 0 FS 18 (SUTURE) IMPLANT
SUT ETHILON 3 0 PS 1 (SUTURE) IMPLANT
SUT FIBERWIRE #2 38 T-5 BLUE (SUTURE)
SUT RETRIEVER MED (INSTRUMENTS) IMPLANT
SUT TIGER TAPE 7 IN WHITE (SUTURE) ×2 IMPLANT
SUT VIC AB 0 CT1 27 (SUTURE)
SUT VIC AB 0 CT1 27XBRD ANBCTR (SUTURE) IMPLANT
SUT VIC AB 2-0 SH 27 (SUTURE)
SUT VIC AB 2-0 SH 27XBRD (SUTURE) IMPLANT
SUT VIC AB 3-0 FS2 27 (SUTURE) IMPLANT
SUTURE FIBERWR #2 38 T-5 BLUE (SUTURE) IMPLANT
TAPE FIBER 2MM 7IN #2 BLUE (SUTURE) ×2 IMPLANT
TOWEL OR 17X24 6PK STRL BLUE (TOWEL DISPOSABLE) ×3 IMPLANT
WATER STERILE IRR 1000ML POUR (IV SOLUTION) ×3 IMPLANT
YANKAUER SUCT BULB TIP NO VENT (SUCTIONS) IMPLANT

## 2014-01-14 NOTE — Anesthesia Postprocedure Evaluation (Signed)
  Anesthesia Post-op Note  Patient: Robert Baker  Procedure(s) Performed: Procedure(s): RIGHT SHOULDER ARTHROSCOPY DEBRIDEMENT,DISTAL CLAVICLE EXCISION,ACROMIOPLASTY,ROTATOR CUFF REPAIR,BICEPS TENODESIS (Right)  Patient Location: PACU  Anesthesia Type:General and block  Level of Consciousness: awake and alert   Airway and Oxygen Therapy: Patient Spontanous Breathing  Post-op Pain: none  Post-op Assessment: Post-op Vital signs reviewed, Patient's Cardiovascular Status Stable and Respiratory Function Stable  Post-op Vital Signs: Reviewed  Filed Vitals:   01/14/14 1115  BP: 112/51  Pulse: 39  Temp:   Resp: 13    Complications: No apparent anesthesia complications

## 2014-01-14 NOTE — Discharge Instructions (Signed)
Shouder arthroscopy, rotator cuff repair, subacromial decompression °Care After Instructions °Refer to this sheet in the next few weeks. These discharge instructions provide you with general information on caring for yourself after you leave the hospital. Your caregiver may also give you specific instructions. Your treatment has been planned according to the most current medical practices available, but unavoidable complications sometimes occur. If you have any problems or questions after discharge, please call your caregiver. ° °HOME INSTRUCTIONS °You may resume a normal diet and activities as directed.  °Take showers instead of baths until informed otherwise.  °Change bandages (dressings) in 3 days.  Swab wounds daily with betadine.  Wash shoulder with soap and water.  Pat dry.  Cover wounds with bandaids. °Only take over-the-counter or prescription medicines for pain, discomfort, or fever as directed by your caregiver.  °Wear your sling for the next 6 weeks unless otherwise instructed. °Eat a well-balanced diet.  °Avoid lifting or driving until you are instructed otherwise.  °Make an appointment to see your caregiver for stitches (suture) or staple removal as directed.  ° °SEEK MEDICAL CARE IF: °You have swelling of your calf or leg.  °You develop shortness of breath or chest pain.  °You have redness, swelling, or increasing pain in the wound.  °There is pus or any unusual drainage coming from the surgical site.  °You notice a bad smell coming from the surgical site or dressing.  °The surgical site breaks open after sutures or staples have been removed.  °There is persistent bleeding from the suture or staple line.  °You are getting worse or are not improving.  °You have any other questions or concerns.  ° °SEEK IMMEDIATE MEDICAL CARE IF:  °You have a fever greater than 101 °You develop a rash.  °You have difficulty breathing.  °You develop any reaction or side effects to medicines given.  °Your knee motion is  decreasing rather than improving.  ° °MAKE SURE YOU:  °Understand these instructions.  °Will watch your condition.  °Will get help right away if you are not doing well or get worse.  ° °Regional Anesthesia Blocks ° °1. Numbness or the inability to move the "blocked" extremity may last from 3-48 hours after placement. The length of time depends on the medication injected and your individual response to the medication. If the numbness is not going away after 48 hours, call your surgeon. ° °2. The extremity that is blocked will need to be protected until the numbness is gone and the  Strength has returned. Because you cannot feel it, you will need to take extra care to avoid injury. Because it may be weak, you may have difficulty moving it or using it. You may not know what position it is in without looking at it while the block is in effect. ° °3. For blocks in the legs and feet, returning to weight bearing and walking needs to be done carefully. You will need to wait until the numbness is entirely gone and the strength has returned. You should be able to move your leg and foot normally before you try and bear weight or walk. You will need someone to be with you when you first try to ensure you do not fall and possibly risk injury. ° °4. Bruising and tenderness at the needle site are common side effects and will resolve in a few days. ° °5. Persistent numbness or new problems with movement should be communicated to the surgeon or the [facility] ([phone])/   [facility] ([phone]). ° ° °Post Anesthesia Home Care Instructions ° °Activity: °Get plenty of rest for the remainder of the day. A responsible adult should stay with you for 24 hours following the procedure.  °For the next 24 hours, DO NOT: °-Drive a car °-Operate machinery °-Drink alcoholic beverages °-Take any medication unless instructed by your physician °-Make any legal decisions or sign important papers. ° °Meals: °Start  with liquid foods such as gelatin or soup. Progress to regular foods as tolerated. Avoid greasy, spicy, heavy foods. If nausea and/or vomiting occur, drink only clear liquids until the nausea and/or vomiting subsides. Call your physician if vomiting continues. ° °Special Instructions/Symptoms: °Your throat may feel dry or sore from the anesthesia or the breathing tube placed in your throat during surgery. If this causes discomfort, gargle with warm salt water. The discomfort should disappear within 24 hours. ° °

## 2014-01-14 NOTE — Anesthesia Procedure Notes (Addendum)
Anesthesia Regional Block:  Interscalene brachial plexus block  Pre-Anesthetic Checklist: ,, timeout performed, Correct Patient, Correct Site, Correct Laterality, Correct Procedure, Correct Position, site marked, Risks and benefits discussed, pre-op evaluation,  At surgeon's request and post-op pain management  Laterality: Right  Prep: Maximum Sterile Barrier Precautions used and chloraprep       Needles:  Injection technique: Single-shot  Needle Type: Echogenic Stimulator Needle     Needle Length: 5cm 5 cm Needle Gauge: 22 and 22 G    Additional Needles:  Procedures: ultrasound guided (picture in chart) and nerve stimulator Interscalene brachial plexus block  Nerve Stimulator or Paresthesia:  Response: Biceps response,   Additional Responses:   Narrative:  Start time: 01/14/2014 8:38 AM End time: 01/14/2014 8:49 AM Injection made incrementally with aspirations every 5 mL. Anesthesiologist: Sampson GoonFitzgerald, MD  Additional Notes: 2% Lidocaine skin wheel.

## 2014-01-14 NOTE — Interval H&P Note (Signed)
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 After consideration of risks, benefits and other options for treatment, the patient has consented to  Procedure(s): RIGHT SHOULDER ARTHROSCOPY DEBRIDEMENT,DISTAL CLAVICLE EXCISION,ACROMIOPLASTY,ROTATOR CUFF REPAIR,BICEPS TENODESIS (Right) as a surgical intervention .  The patient's history has been reviewed, patient examined, no change in status, stable for surgery.  I have reviewed the patient's chart and labs.  Questions were answered to the patient's satisfaction.     [name] F

## 2014-01-14 NOTE — Transfer of Care (Signed)
Immediate Anesthesia Transfer of Care Note  Patient: Robert Baker  Procedure(s) Performed: Procedure(s): RIGHT SHOULDER ARTHROSCOPY DEBRIDEMENT,DISTAL CLAVICLE EXCISION,ACROMIOPLASTY,ROTATOR CUFF REPAIR,BICEPS TENODESIS (Right)  Patient Location: PACU  Anesthesia Type:GA combined with regional for post-op pain  Level of Consciousness: sedated  Airway & Oxygen Therapy: Patient Spontanous Breathing and Patient connected to face mask oxygen  Post-op Assessment: Report given to PACU RN and Post -op Vital signs reviewed and stable  Post vital signs: Reviewed and stable  Complications: No apparent anesthesia complications

## 2014-01-14 NOTE — Anesthesia Preprocedure Evaluation (Addendum)
Anesthesia Evaluation  Patient identified by MRN, date of birth, ID band Patient awake    Reviewed: Allergy & Precautions, H&P , NPO status , Patient's Chart, lab work & pertinent test results  Airway Mallampati: II TM Distance: >3 FB Neck ROM: Full    Dental no notable dental hx. (+) Teeth Intact, Dental Advisory Given   Pulmonary asthma ,  breath sounds clear to auscultation  Pulmonary exam normal       Cardiovascular negative cardio ROS  Rhythm:Regular Rate:Bradycardia     Neuro/Psych negative neurological ROS  negative psych ROS   GI/Hepatic negative GI ROS, Neg liver ROS,   Endo/Other  diabetes, Well Controlled, Type 1, Insulin Dependent  Renal/GU negative Renal ROS  negative genitourinary   Musculoskeletal   Abdominal   Peds  Hematology negative hematology ROS (+)   Anesthesia Other Findings   Reproductive/Obstetrics negative OB ROS                          Anesthesia Physical Anesthesia Plan  ASA: III  Anesthesia Plan: General and Regional   Post-op Pain Management:    Induction: Intravenous  Airway Management Planned: Oral ETT  Additional Equipment:   Intra-op Plan:   Post-operative Plan: Extubation in OR  Informed Consent: I have reviewed the patients History and Physical, chart, labs and discussed the procedure including the risks, benefits and alternatives for the proposed anesthesia with the patient or authorized representative who has indicated his/her understanding and acceptance.   Dental advisory given  Plan Discussed with: CRNA  Anesthesia Plan Comments:         Anesthesia Quick Evaluation

## 2014-01-14 NOTE — Progress Notes (Signed)
Assisted Dr. Edmond Fitzgerald with right, ultrasound guided, interscalene  block. Side rails up, monitors on throughout procedure. See vital signs in flow sheet. Tolerated Procedure well. 

## 2014-01-15 ENCOUNTER — Encounter (HOSPITAL_BASED_OUTPATIENT_CLINIC_OR_DEPARTMENT_OTHER): Payer: Self-pay | Admitting: Orthopedic Surgery

## 2014-01-15 NOTE — Addendum Note (Signed)
Addendum created 01/15/14 0715 by Tonette Bihariimothy Zealand Boyett   Modules edited: Charges VN

## 2014-01-18 NOTE — Op Note (Signed)
NAMDeborah Baker:  Baker, Robert                  ACCOUNT NO.:  [id number]  MEDICAL RECORD NO.:  [id number]  LOCATION:                                 FACILITY:  PHYSICIAN:  Loreta Aveaniel F. Menno Vanbergen, M.D. DATE OF BIRTH:  [birth year]  DATE OF PROCEDURE:  01/14/2014 DATE OF DISCHARGE:  01/14/2014                              OPERATIVE REPORT   PREOPERATIVE DIAGNOSIS:  Right shoulder complete rotator cuff tear. Previous decompression done in the 1990s.  POSTOPERATIVE DIAGNOSIS:  Right shoulder complete rotator cuff tear, previous decompression done in the 1990s with some mild overgrowth of the acromion, distal clavicle excision, but loose body and a little bit of recurrent spur formation there.  Complete functional tear of the cuff throughout the crescent region and anterior anchor.  Intra and extra- articular adhesions.  PROCEDURE:  Right shoulder exam under anesthesia, arthroscopy.  Lysis of debridement adhesions throughout.  Debridement and mobilization of rotator cuff with arthroscopic assisted repair, FiberWire suture x2, SwiveLock anchors x2.  Revision of acromioplasty.  Re-release CA ligament.  Removal of loose bodies and re-grown spurs AC interval.  SURGEON:  Loreta Aveaniel F. Genese Quebedeaux, M.D.  ASSISTANT:  Odelia GageLindsey Anton, PA, present throughout the entire case and necessary for timely completion of procedure.  ANESTHESIA:  General.  BLOOD LOSS:  Minimal.  SPECIMENS:  None.  CULTURES:  None.  COMPLICATIONS:  None.  DRESSING:  Soft compressive, shoulder immobilizer.  PROCEDURE IN DETAIL:  The patient was brought to the operating room, placed on the operating table in supine position.  After adequate anesthesia had been obtained, shoulder examined.  Some mild adhesions. Manipulated achieving full motion, stable shoulder without much difficulty.  Placed in a beach-chair position, on the shoulder positioner, prepped and draped in usual sterile fashion.  Three portals; anterior, posterior, and lateral.   Arthroscope introduced, shoulder then inspected.  Really minimal degenerative change.  Biceps tendon, biceps anchor intact.  Little fraying of anterior labrum was debrided.  The calcifications seen on the x-ray was not intra or extra-articular, but well medial and posterior.  Functional full-thickness tear throughout the crescent region of the supraspinatus and anterior anchor.  This was debrided.  Mobilized for repair.  Tuberosity roughened.  Cannula was redirected subacromially.  Type 2 acromion.  Revision acromioplasty to a type 1, re-releasing CA ligament.  Adequate distal clavicle excision, but a new loose body within [facility] was found.  Partially tethered. This was freed up and removed with a bur and shaver.  Adequacy of decompression, distal clavicle excision __________ all confirmed. Through the lateral portal cannula placed.  Mobilized cuff was __________ 2 horizontal mattress sutures with the Scorpion device. Firmly anchored down the tuberosity with 2 pre-punched SwiveLock anchors.  At completion, adequacy of decompression and debridement confirmed.  Of note, the calcification within the tear of the cuff had been debrided before repair.  Instruments were __________ removed. Portals were closed with nylon.  Sterile compressive dressing applied. Shoulder immobilizer applied.  Anesthesia reversed.  Brought to the recovery room.  Tolerated the surgery well.  No complications.     Loreta Aveaniel F. Cherylyn Sundby, M.D.     DFM/MEDQ  D:  01/14/2014  T:  01/14/2014  Job:  [id number]

## 2014-02-01 ENCOUNTER — Encounter: Payer: Self-pay | Admitting: Podiatrist

## 2014-03-22 ENCOUNTER — Telehealth: Payer: Self-pay | Admitting: Internal Medicine

## 2014-03-22 NOTE — Telephone Encounter (Signed)
Called spoke with pt No openings with [facility] in all of Jan, pt okay with seeing TP Appt scheduled for 1.7.16 @ 3.30pm Pt aware to call for sooner appt if symptoms worsen prior to ov  Nothing further needed at this time; will sign off

## 2014-03-22 NOTE — Telephone Encounter (Signed)
Next available with me or TP

## 2014-03-22 NOTE — Telephone Encounter (Signed)
Called, spoke with pt -  C/o chronic problem with congestion x 6 months.  Has cough with mostly yellow, occas clear mucus.  Occas wheezing.  Minimal chest tightness.  No increased SOB or f/c/s.  [facility] and given singulair.  No relief.  Has increased dulera from 2 puffs bid to 3 puffs bid with some relief.  Requesting OV with Dr. Maple HudsonYoung -- please advise. Thank you.  Last OV with CY: Aug 10, 2013; was asked to f/u in 1 yr  Allergies  Allergen Reactions  . Chocolate Other (See Comments)    GI UPSET     Current Outpatient Prescriptions on File Prior to Visit  Medication Sig Dispense Refill  . albuterol (PROVENTIL HFA;VENTOLIN HFA) 108 (90 BASE) MCG/ACT inhaler Inhale into the lungs every 6 (six) hours as needed for wheezing or shortness of breath.    . Coenzyme Q10 (CO Q-10) 100 MG CAPS Take 3 capsules by mouth daily.     . fluticasone (VERAMYST) 27.5 MCG/SPRAY nasal spray Place 2 sprays into the nose daily.    Marland Kitchen. guaiFENesin (MUCINEX) 600 MG 12 hr tablet Take by mouth 2 (two) times daily.    . insulin glargine (LANTUS) 100 UNIT/ML injection Inject 14 Units into the skin daily.     . insulin lispro (HUMALOG) 100 UNIT/ML injection Inject 20 Units into the skin daily.     Marland Kitchen. loratadine-pseudoephedrine (CLARITIN-D 12-HOUR) 5-120 MG per tablet Take 1 tablet by mouth as needed.      . mometasone-formoterol (DULERA) 100-5 MCG/ACT AERO Inhale 2 puffs into the lungs 2 (two) times daily. 3 Inhaler 3  . Multiple Vitamin (MULTIVITAMIN) tablet Take 1 tablet by mouth daily.    . ondansetron (ZOFRAN) 4 MG tablet Take 1 tablet (4 mg total) by mouth every 8 (eight) hours as needed for nausea or vomiting. 40 tablet 0  . oxyCODONE-acetaminophen (ROXICET) 5-325 MG per tablet Take 1-2 tablets by mouth every 4 (four) hours as needed. 60 tablet 0  . pravastatin (PRAVACHOL) 20 MG tablet Take 20 mg by mouth daily.     No current facility-administered medications on file prior to visit.

## 2014-04-01 ENCOUNTER — Encounter: Payer: Self-pay | Admitting: Adult Health

## 2014-04-01 ENCOUNTER — Ambulatory Visit (INDEPENDENT_AMBULATORY_CARE_PROVIDER_SITE_OTHER): Payer: Federal, State, Local not specified - PPO | Admitting: Adult Health

## 2014-04-01 ENCOUNTER — Ambulatory Visit (INDEPENDENT_AMBULATORY_CARE_PROVIDER_SITE_OTHER)
Admission: RE | Admit: 2014-04-01 | Discharge: 2014-04-01 | Disposition: A | Discharge status: Home / Self Care | Payer: Federal, State, Local not specified - PPO | Source: Ambulatory Visit | Attending: Adult Health | Admitting: Adult Health

## 2014-04-01 VITALS — BP 130/70 | HR 57 | Temp 97.7°F | Ht 72.0 in | Wt 182.0 lb

## 2014-04-01 DIAGNOSIS — R05 Cough: Secondary | ICD-10-CM

## 2014-04-01 DIAGNOSIS — R059 Cough, unspecified: Secondary | ICD-10-CM

## 2014-04-01 DIAGNOSIS — J4520 Mild intermittent asthma, uncomplicated: Secondary | ICD-10-CM

## 2014-04-01 MED ORDER — LEVALBUTEROL HCL 0.63 MG/3ML IN NEBU
0.6300 mg | INHALATION_SOLUTION | Freq: Once | RESPIRATORY_TRACT | Status: AC
  Administered 2014-04-01: 0.63 mg via RESPIRATORY_TRACT

## 2014-04-01 NOTE — Assessment & Plan Note (Signed)
Mild flare with AR  Check cxr  xopenex neb x 1   Plan  Begin Zyrtec 10mg  .At bedtime   Delsym 2 tsp Twice daily  As needed  Cough  Restart Veramyst 1 puff Twice daily   Decrease Dulera 2 puffs Twice daily   Chest xray today  Please contact office for sooner follow up if symptoms do not improve or worsen or seek emergency care  Follow up Dr. Maple HudsonYoung  As planned and As needed

## 2014-04-01 NOTE — Progress Notes (Signed)
Subjective:    Patient ID: Robert Baker, male    DOB: 06/22/1950, 64 y.o.   MRN: [id number]  HPI 08/07/10- 60 yoM never smoker, followed for asthma and allergic rhinitis, complicated by DM. Last here- September 06, 2009 when he was noting some persistent cough despite Symbicort. Bradycardia was [facility] by [facility] and not an issue. . This Spring he has needed to use a rescue inhaler about twice weekly- ? Due to pollen. Eyes have itched more and has felt some throat congestion with a little morning phlegm. Not being wakened and little aware of wheeze.  He continues to be physically quite active.   08/07/11-60 yoM never smoker, followed for asthma and allergic rhinitis, complicated by DM. Chest congestion past month-unsure if related to pollen; Feels as he needs to go back up on Symbicort-having wheezing  He tried reducing Symbicort from 160-80, but had to increase Symbicort 80-4 puffs twice daily to keep asthma controlled. Failed Singulair years ago. Uses rescue inhaler about twice a week after increased exertion. Increased nasal congestion and rhinorrhea in the past month but does not use Flonase regularly.  08/07/12- 62 yoM never smoker, followed for asthma and allergic rhinitis, complicated by DM. FOLLOWS FOR: Increased congestion in throat area. Denies any SOb, wheezing, or cough at this time. Likes Dulera 100. Using rescue inhaler less than once per week. Some pollen rhinitis on Flonase.  08/10/13- 63 yoM never smoker, followed for asthma and allergic rhinitis, complicated by DM. FOLLOWS FOR: congestion caught in throat area alot lately. Has sneezing and/or cough when outside and pollen count is high or mowing yard. Found saline nasal rinse helpful in the past. Using Flonase intermittently because steady use causes nosebleeds.   04/01/2014 Acute OV -never smoker, Asthma and AR c/by DM-Type 1 on insulin   Complains of persistent cough, congestion, drainage for last several months.  Has on  /off yellow white mucus. No fever, hemoptysis, chest pain , orthopnea or edema.  Seen by PCP , few weeks ago, instructed to increase Dulera 3 puffs Twice daily  .  Currently on Singuair . Uses proair  Cough is worst symptom , wakes him up at night.   ROS-see HPI Constitutional:   No  weight loss, night sweats,  Fevers, chills, fatigue, or  lassitude.  HEENT:   No headaches,  Difficulty swallowing,  Tooth/dental problems, or  Sore throat,                No sneezing, itching, ear ache,  +nasal congestion, post nasal drip,   CV:  No chest pain,  Orthopnea, PND, swelling in lower extremities, anasarca, dizziness, palpitations, syncope.   GI  No heartburn, indigestion, abdominal pain, nausea, vomiting, diarrhea, change in bowel habits, loss of appetite, bloody stools.   Resp: No shortness of breath with exertion or at rest.  No excess mucus, no productive cough,  No non-productive cough,  No coughing up of blood.  No change in color of mucus.  No wheezing.  No chest wall deformity  Skin: no rash or lesions.  GU: no dysuria, change in color of urine, no urgency or frequency.  No flank pain, no hematuria   MS:  No joint pain or swelling.  No decreased range of motion.  No back pain.  Psych:  No change in mood or affect. No depression or anxiety.  No memory loss.      OBJ- Physical Exam GEN: A/Ox3; pleasant , NAD, well nourished   HEENT:  Door/AT,  EACs-clear, TMs-wnl, NOSE-clear drainage  THROAT-clear, no lesions, no postnasal drip or exudate noted.   NECK:  Supple w/ fair ROM; no JVD; normal carotid impulses w/o bruits; no thyromegaly or nodules palpated; no lymphadenopathy.  RESP  Clear  P & A; w/o, wheezes/ rales/ or rhonchi.no accessory muscle use, no dullness to percussion  CARD:  RRR, no m/r/g  , no peripheral edema, pulses intact, no cyanosis or clubbing.  GI:   Soft & nt; nml bowel sounds; no organomegaly or masses detected.  Musco: Warm bil, no deformities or joint swelling  noted.   Neuro: alert, no focal deficits noted.    Skin: Warm, no lesions or rashes

## 2014-04-01 NOTE — Patient Instructions (Addendum)
Begin Zyrtec 10mg  .At bedtime   Delsym 2 tsp Twice daily  As needed  Cough  Restart Veramyst 1 puff Twice daily   Decrease Dulera 2 puffs Twice daily   Chest xray today  Please contact office for sooner follow up if symptoms do not improve or worsen or seek emergency care  Follow up Dr. Maple HudsonYoung  As planned and As needed

## 2014-04-06 ENCOUNTER — Telehealth: Payer: Self-pay | Admitting: Adult Health

## 2014-04-06 NOTE — Progress Notes (Signed)
Quick Note:  Spoke with pt and notified of results per Tammy Parrett, NP. Pt verbalized understanding and denied any questions.  ______ 

## 2014-04-06 NOTE — Telephone Encounter (Signed)
Notes Recorded by Julio Sicksammy S Parrett, NP on 04/05/2014 at 6:26 PM No sign of PNA or acute process Please contact office for sooner follow up if symptoms do not improve or worsen or seek emergency care   Spoke with pt and notified of results per Tammy Parrett,NP . Pt verbalized understanding and denied any questions.

## 2014-08-11 ENCOUNTER — Encounter: Payer: Self-pay | Admitting: Internal Medicine

## 2014-08-11 ENCOUNTER — Ambulatory Visit (INDEPENDENT_AMBULATORY_CARE_PROVIDER_SITE_OTHER): Payer: Federal, State, Local not specified - PPO | Admitting: Internal Medicine

## 2014-08-11 ENCOUNTER — Other Ambulatory Visit (HOSPITAL_COMMUNITY): Payer: Self-pay | Admitting: Internal Medicine

## 2014-08-11 VITALS — BP 114/62 | HR 47 | Ht 71.0 in | Wt 178.0 lb

## 2014-08-11 DIAGNOSIS — J387 Other diseases of larynx: Secondary | ICD-10-CM | POA: Diagnosis not present

## 2014-08-11 DIAGNOSIS — J449 Chronic obstructive pulmonary disease, unspecified: Secondary | ICD-10-CM | POA: Diagnosis not present

## 2014-08-11 DIAGNOSIS — K219 Gastro-esophageal reflux disease without esophagitis: Secondary | ICD-10-CM

## 2014-08-11 MED ORDER — AZITHROMYCIN 250 MG PO TABS
ORAL_TABLET | ORAL | Status: DC
Start: 2014-08-11 — End: 2015-02-11

## 2014-08-11 MED ORDER — MOMETASONE FURO-FORMOTEROL FUM 100-5 MCG/ACT IN AERO: 2.0000 | INHALATION_SPRAY | Freq: Two times a day (BID) | RESPIRATORY_TRACT | Status: DC

## 2014-08-11 NOTE — Progress Notes (Signed)
Subjective:    Patient ID: Robert Baker, male    DOB: [birth year], 64 y.o.   MRN: [id number]  HPI 08/07/10- 60 yoM never smoker, followed for asthma and allergic rhinitis, complicated by DM. Last here- September 06, 2009 when he was noting some persistent cough despite Symbicort. Bradycardia was [facility] by [facility] and not an issue. . This Spring he has needed to use a rescue inhaler about twice weekly- ? Due to pollen. Eyes have itched more and has felt some throat congestion with a little morning phlegm. Not being wakened and little aware of wheeze.  He continues to be physically quite active.   08/07/11-60 yoM never smoker, followed for asthma and allergic rhinitis, complicated by DM. Chest congestion past month-unsure if related to pollen; Feels as he needs to go back up on Symbicort-having wheezing  He tried reducing Symbicort from 160-80, but had to increase Symbicort 80-4 puffs twice daily to keep asthma controlled. Failed Singulair years ago. Uses rescue inhaler about twice a week after increased exertion. Increased nasal congestion and rhinorrhea in the past month but does not use Flonase regularly.  08/07/12- 62 yoM never smoker, followed for asthma and allergic rhinitis, complicated by DM. FOLLOWS FOR: Increased congestion in throat area. Denies any SOb, wheezing, or cough at this time. Likes Dulera 100. Using rescue inhaler less than once per week. Some pollen rhinitis on Flonase.  08/10/13- 63 yoM never smoker, followed for asthma and allergic rhinitis, complicated by DM. FOLLOWS FOR: congestion caught in throat area alot lately. Has sneezing and/or cough when outside and pollen count is high or mowing yard. Found saline nasal rinse helpful in the past. Using Flonase intermittently because steady use causes nosebleeds.   04/01/2014 Acute OV -never smoker, Asthma and AR c/by DM-Type 1 on insulin   Complains of persistent cough, congestion, drainage for last several months.  Has on  /off yellow white mucus. No fever, hemoptysis, chest pain , orthopnea or edema.  Seen by PCP , few weeks ago, instructed to increase Dulera 3 puffs Twice daily  .  Currently on Singuair . Uses proair  Cough is worst symptom , wakes him up at night.   08/11/14- 64 yoM never smoker, followed for asthma and allergic rhinitis, complicated by DM. Follow up  slight congestion coughing up mucus yellow in color no headaches stays hydrated Bradycardia noted 47- normal for him per synopsis Persistent mild chest congestion. Little or no sputum but was bringing up some light green in January. Occasionally uses albuterol inhaler and continues Dulera twice daily. CXR 04/01/14 IMPRESSION: Minimal hyperinflation which could be related to history of asthma. No acute infiltrate. Electronically Signed  By: Ulyses SouthwardMark Boles M.D.  On: 04/01/2014 17:12  ROS-see HPI   Negative unless "+" Constitutional:    weight loss, night sweats, fevers, chills, fatigue, lassitude. HEENT:    headaches, difficulty swallowing, tooth/dental problems, sore throat,       sneezing, itching, ear ache, nasal congestion, post nasal drip, snoring CV:    chest pain, orthopnea, PND, swelling in lower extremities, anasarca,                                  dizziness, palpitations Resp:   +shortness of breath with exertion or at rest.                productive cough,   non-productive cough, coughing up of blood.  change in color of mucus.  wheezing.   Skin:    rash or lesions. GI:  No-   heartburn, indigestion, abdominal pain, nausea, vomiting, diarrhea,                 change in bowel habits, loss of appetite GU:  MS:   joint pain, stiffness, decreased range of motion, back pain. Neuro-     nothing unusual Psych:  change in mood or affect.  depression or anxiety.   memory loss.  OBJ- Physical Exam General- Alert, Oriented, Affect-appropriate, Distress- none acute Skin- rash-none, lesions- none, excoriation-  none Lymphadenopathy- none Head- atraumatic            Eyes- Gross vision intact, PERRLA, conjunctivae and secretions clear            Ears- Hearing aids+            Nose- Clear, no-Septal dev, mucus, polyps, erosion, perforation             Throat- Mallampati II , mucosa clear , drainage- none, tonsils- atrophic Neck- flexible , trachea midline, no stridor , thyroid nl, carotid no bruit Chest - symmetrical excursion , unlabored           Heart/CV- RRR , no murmur , no gallop  , no rub, nl s1 s2                           - JVD- none , edema- none, stasis changes- none, varices- none           Lung- clear to P&A, wheeze- none, cough- none , dullness-none, rub- none           Chest wall-  Abd-  Br/ Gen/ Rectal- Not done, not indicated Extrem- cyanosis- none, clubbing, none, atrophy- none, strength- nl Neuro- grossly intact to observation

## 2014-08-11 NOTE — Patient Instructions (Addendum)
Order- schedule Speech therapy assisted modified barium swallow   Dx laryngophayngeal reflux  Script sent for Z pak   Sample Stiolto Respimat inhaler   2 puffs, once daily     Try this instead of Dulera  Back[facility]up [facility] so you can go back to it after trying [employer]

## 2014-08-17 ENCOUNTER — Telehealth: Payer: Self-pay | Admitting: Internal Medicine

## 2014-08-17 MED ORDER — TIOTROPIUM BROMIDE-OLODATEROL 2.5-2.5 MCG/ACT IN AERS: 2.0000 | INHALATION_SPRAY | Freq: Every day | RESPIRATORY_TRACT | Status: DC

## 2014-08-17 NOTE — Telephone Encounter (Signed)
Rx has been sent in. Pt is aware. Nothing further was needed. 

## 2014-08-19 ENCOUNTER — Telehealth: Payer: Self-pay | Admitting: Internal Medicine

## 2014-08-19 MED ORDER — BUDESONIDE-FORMOTEROL FUMARATE 160-4.5 MCG/ACT IN AERO: 2.0000 | INHALATION_SPRAY | Freq: Two times a day (BID) | RESPIRATORY_TRACT | Status: DC

## 2014-08-19 MED ORDER — ALBUTEROL SULFATE HFA 108 (90 BASE) MCG/ACT IN AERS: INHALATION_SPRAY | RESPIRATORY_TRACT | Status: DC

## 2014-08-19 NOTE — Telephone Encounter (Signed)
Ok to Rx Symbicort 160. # 3, 2 puffs then rinse mouth, twice daily,  ref x 3                  Proair # 3      2 puffs every 4-6 hours if needed    Ref x 3

## 2014-08-19 NOTE — Telephone Encounter (Signed)
Pt is not going to be able to use the Stiolto d/t cost - $170/mo Pt states that the Symbicort was more effective than the Dulera.  Pt feels that he needs to be back on Symbicort 160mcg  [facility], pt requests a 3 month supply.  Pt states that he needs his Rx changed to Proair instead of Proventil/Ventolin because the Proair has a better copay.  This has been changed.   Will send to Dr Maple HudsonYoung to address the question about Symbicort.  Allergies  Allergen Reactions  . Chocolate Other (See Comments)    GI UPSET     Medication List       This list is accurate as of: 08/19/14 11:33 AM.  Always use your most recent med list.               albuterol 108 (90 BASE) MCG/ACT inhaler  Commonly known as:  PROVENTIL HFA;VENTOLIN HFA  Inhale into the lungs every 6 (six) hours as needed for wheezing or shortness of breath.     azithromycin 250 MG tablet  Commonly known as:  ZITHROMAX  2 today then one daily     Co Q-10 100 MG Caps  Take 3 capsules by mouth daily.     fluticasone 27.5 MCG/SPRAY nasal spray  Commonly known as:  VERAMYST  Place 2 sprays into the nose daily.     guaiFENesin 600 MG 12 hr tablet  Commonly known as:  MUCINEX  Take by mouth 2 (two) times daily.     HUMALOG 100 UNIT/ML injection  Generic drug:  insulin lispro  Inject 20 Units into the skin daily.     LANTUS 100 UNIT/ML injection  Generic drug:  insulin glargine  Inject 14 Units into the skin daily.     loratadine-pseudoephedrine 5-120 MG per tablet  Commonly known as:  CLARITIN-D 12-hour  Take 1 tablet by mouth as needed.     mometasone-formoterol 100-5 MCG/ACT Aero  Commonly known as:  DULERA  Inhale 2 puffs into the lungs 2 (two) times daily.     multivitamin tablet  Take 1 tablet by mouth daily.     pravastatin 20 MG tablet  Commonly known as:  PRAVACHOL  Take 20 mg by mouth daily.     Tiotropium Bromide-Olodaterol 2.5-2.5 MCG/ACT Aers  Commonly known as:  STIOLTO  RESPIMAT  Inhale 2 puffs into the lungs daily.

## 2014-08-19 NOTE — Telephone Encounter (Signed)
Called pt and rx's sent in. Nothing further needed

## 2014-08-22 NOTE — Assessment & Plan Note (Signed)
Moderate persistent without acute complication Plan-modified barium swallow to look for possible low-grade aspiration/LPR, Z-Pak, sample Stiolto inhaler

## 2014-08-25 ENCOUNTER — Ambulatory Visit (HOSPITAL_COMMUNITY)
Admission: RE | Admit: 2014-08-25 | Discharge: 2014-08-25 | Disposition: A | Discharge status: Home / Self Care | Payer: Federal, State, Local not specified - PPO | Source: Ambulatory Visit | Attending: Internal Medicine | Admitting: Internal Medicine

## 2014-08-25 ENCOUNTER — Other Ambulatory Visit (HOSPITAL_COMMUNITY): Payer: Self-pay | Admitting: Internal Medicine

## 2014-08-25 DIAGNOSIS — R1312 Dysphagia, oropharyngeal phase: Secondary | ICD-10-CM | POA: Diagnosis not present

## 2014-08-25 DIAGNOSIS — K219 Gastro-esophageal reflux disease without esophagitis: Secondary | ICD-10-CM

## 2014-08-27 ENCOUNTER — Telehealth: Payer: Self-pay | Admitting: Internal Medicine

## 2014-08-27 NOTE — Progress Notes (Signed)
Quick Note:  lmtcb for pt. ______ 

## 2014-08-27 NOTE — Telephone Encounter (Signed)
Notes Recorded by Nicanor AlconElise K Nagle, RN on 08/27/2014 at 3:32 PM lmtcb for pt. ------  Notes Recorded by Waymon Budgelinton D Young, MD on 08/26/2014 at 12:44 PM Modified barium swallow showed mild difficulty clearing throat, so need to swallow carefully and deliberately. High likelihood of at least occasional reflux up into throat and upper airway. Don't lie down for an hour after meals ----- Pt is aware of results.

## 2014-09-20 ENCOUNTER — Other Ambulatory Visit: Payer: Self-pay

## 2015-02-11 ENCOUNTER — Encounter: Payer: Self-pay | Admitting: Internal Medicine

## 2015-02-11 ENCOUNTER — Ambulatory Visit (INDEPENDENT_AMBULATORY_CARE_PROVIDER_SITE_OTHER)
Admission: RE | Admit: 2015-02-11 | Discharge: 2015-02-11 | Disposition: A | Discharge status: Home / Self Care | Payer: Federal, State, Local not specified - PPO | Source: Ambulatory Visit | Attending: Internal Medicine | Admitting: Internal Medicine

## 2015-02-11 ENCOUNTER — Ambulatory Visit (INDEPENDENT_AMBULATORY_CARE_PROVIDER_SITE_OTHER): Payer: Federal, State, Local not specified - PPO | Admitting: Internal Medicine

## 2015-02-11 VITALS — BP 124/62 | HR 55 | Ht 71.0 in | Wt 178.2 lb

## 2015-02-11 DIAGNOSIS — J4530 Mild persistent asthma, uncomplicated: Secondary | ICD-10-CM

## 2015-02-11 DIAGNOSIS — J45909 Unspecified asthma, uncomplicated: Secondary | ICD-10-CM

## 2015-02-11 DIAGNOSIS — J449 Chronic obstructive pulmonary disease, unspecified: Secondary | ICD-10-CM | POA: Diagnosis not present

## 2015-02-11 DIAGNOSIS — J302 Other seasonal allergic rhinitis: Secondary | ICD-10-CM

## 2015-02-11 NOTE — Patient Instructions (Addendum)
Order- office spirometry   Dx asthma mild persistent                Then give neb xop 0.63  Order- CXR     Dx asthma mild persistent  Try using your rescue inhaler before bed to see if that affects your left side wheeze

## 2015-02-11 NOTE — Assessment & Plan Note (Signed)
Question why wheezing is associated only with left lung and especially in left decubitus. Considered fluid shift during the night related to his pedal edema, but that wouldn't explain asymmetry of wheeze on current exam. Plan-chest x-ray, office spirometry, use rescue inhaler at bedtime

## 2015-02-11 NOTE — Assessment & Plan Note (Signed)
Adequate control now outside of pollen season

## 2015-02-11 NOTE — Progress Notes (Signed)
Subjective:    Patient ID: Robert Baker, male    DOB: May 20, 1950, 64 y.o.   MRN: [id number]  HPI 08/07/10- 60 yoM never smoker, followed for asthma and allergic rhinitis, complicated by DM. Last here- September 06, 2009 when he was noting some persistent cough despite Symbicort. [facility]cardia [facility] and not an issue. . This Spring he has needed to use a rescue inhaler about twice weekly- ? Due to pollen. Eyes have itched more and has felt some throat congestion with a little morning phlegm. Not being wakened and little aware of wheeze.  He continues to be physically quite active.   08/07/11-60 yoM never smoker, followed for asthma and allergic rhinitis, complicated by DM. Chest congestion past month-unsure if related to pollen; Feels as he needs to go back up on Symbicort-having wheezing  He tried reducing Symbicort from 160-80, but had to increase Symbicort 80-4 puffs twice daily to keep asthma controlled. Failed Singulair years ago. Uses rescue inhaler about twice a week after increased exertion. Increased nasal congestion and rhinorrhea in the past month but does not use Flonase regularly.  08/07/12- 62 yoM never smoker, followed for asthma and allergic rhinitis, complicated by DM. FOLLOWS FOR: Increased congestion in throat area. Denies any SOb, wheezing, or cough at this time. Likes Dulera 100. Using rescue inhaler less than once per week. Some pollen rhinitis on Flonase.  08/10/13- 63 yoM never smoker, followed for asthma and allergic rhinitis, complicated by DM. FOLLOWS FOR: congestion caught in throat area alot lately. Has sneezing and/or cough when outside and pollen count is high or mowing yard. Found saline nasal rinse helpful in the past. Using Flonase intermittently because steady use causes nosebleeds.   04/01/2014 Acute OV -never smoker, Asthma and AR c/by DM-Type 1 on insulin   Complains of persistent cough, congestion, drainage for last several months.  Has on  /off yellow white mucus. No fever, hemoptysis, chest pain , orthopnea or edema.  Seen by PCP , few weeks ago, instructed to increase Dulera 3 puffs Twice daily  .  Currently on Singuair . Uses proair  Cough is worst symptom , wakes him up at night.   08/11/14- 64 yoM never smoker, followed for asthma and allergic rhinitis, complicated by DM. Follow up  slight congestion coughing up mucus yellow in color no headaches stays hydrated Bradycardia noted 47- normal for him per synopsis Persistent mild chest congestion. Little or no sputum but was bringing up some light green in January. Occasionally uses albuterol inhaler and continues Dulera twice daily. CXR 04/01/14 IMPRESSION: Minimal hyperinflation which could be related to history of asthma. No acute infiltrate. Electronically Signed  By: Ulyses SouthwardMark Boles M.D.  On: 04/01/2014 17:12  02/11/2015-64 year old male never smoker followed for asthma and allergic rhinitis, complicated by DM FOLLOWS FOR: Pt states he has noticed that if laying on left side that he has wheezing but not if he lays on right side. Slight cough as well. Speech pathology Swallowing-08/25/2014-high likelihood of LPR, mild dysphagia without aspiration Around 3 or 4 AM most nights over the last 2 or 3 months, he has noticed wheezing only if he lies on his left side down. Using Symbicort twice daily. Feet swell a little. Office Spirometry 02/11/2015-within normal limits. FEV1/FVC 0.74, FEF 25-75 percent 76%  ROS-see HPI   Negative unless "+" Constitutional:    weight loss, night sweats, fevers, chills, fatigue, lassitude. HEENT:    headaches, difficulty swallowing, tooth/dental problems, sore throat,  sneezing, itching, ear ache, nasal congestion, post nasal drip, snoring CV:    chest pain, orthopnea, PND, swelling in lower extremities, anasarca,                                                          dizziness, palpitations Resp:   +shortness of breath with exertion or at  rest.                productive cough,   non-productive cough, coughing up of blood.              change in color of mucus.  + wheezing.   Skin:    rash or lesions. GI:  No-   heartburn, indigestion, abdominal pain, nausea, vomiting,  GU:  MS:   joint pain, stiffness,  Neuro-     nothing unusual Psych:  change in mood or affect.  depression or anxiety.   memory loss.  OBJ- Physical Exam General- Alert, Oriented, Affect-appropriate, Distress- none acute, room air sat 96% Skin- rash-none, lesions- none, excoriation- none Lymphadenopathy- none Head- atraumatic            Eyes- Gross vision intact, PERRLA, conjunctivae and secretions clear            Ears- Hearing aids+            Nose- Clear, no-Septal dev, mucus, polyps, erosion, perforation             Throat- Mallampati II , mucosa clear , drainage- none, tonsils- atrophic Neck- flexible , trachea midline, no stridor , thyroid nl, carotid no bruit Chest - symmetrical excursion , unlabored           Heart/CV- RRR , no murmur , no gallop  , no rub, nl s1 s2                           - JVD- none , edema- none, stasis changes- none, varices- none           Lung- clear to P&A, wheeze + slight expiratory left chest, cough- none , dullness-none, rub- none           Chest wall-  Abd-  Br/ Gen/ Rectal- Not done, not indicated Extrem- cyanosis- none, clubbing, none, atrophy- none, strength- nl Neuro- grossly intact to observation

## 2015-02-14 NOTE — Progress Notes (Signed)
Quick Note:  Called and spoke to pt. Informed him of the results and recs per CY. Pt verbalized understanding and denied any further questions or concerns at this time.   ______ 

## 2015-03-04 ENCOUNTER — Encounter: Payer: Self-pay | Admitting: Internal Medicine

## 2015-04-01 DIAGNOSIS — M25561 Pain in right knee: Secondary | ICD-10-CM | POA: Diagnosis not present

## 2015-04-11 DIAGNOSIS — Z23 Encounter for immunization: Secondary | ICD-10-CM | POA: Diagnosis not present

## 2015-04-11 DIAGNOSIS — M1990 Unspecified osteoarthritis, unspecified site: Secondary | ICD-10-CM | POA: Diagnosis not present

## 2015-04-20 ENCOUNTER — Telehealth: Payer: Self-pay | Admitting: Internal Medicine

## 2015-04-20 MED ORDER — BUDESONIDE-FORMOTEROL FUMARATE 160-4.5 MCG/ACT IN AERO: 2.0000 | INHALATION_SPRAY | Freq: Two times a day (BID) | RESPIRATORY_TRACT | Status: DC

## 2015-04-20 MED ORDER — ALBUTEROL SULFATE HFA 108 (90 BASE) MCG/ACT IN AERS: INHALATION_SPRAY | RESPIRATORY_TRACT | Status: DC

## 2015-04-20 NOTE — Telephone Encounter (Signed)
Spoke with pt. He needs refills on Symbicort and ProAir. These have been sent to [facility]. Nothing further was needed.

## 2015-05-03 DIAGNOSIS — N183 Chronic kidney disease, stage 3 (moderate): Secondary | ICD-10-CM | POA: Diagnosis not present

## 2015-05-03 DIAGNOSIS — D631 Anemia in chronic kidney disease: Secondary | ICD-10-CM | POA: Diagnosis not present

## 2015-05-03 DIAGNOSIS — N189 Chronic kidney disease, unspecified: Secondary | ICD-10-CM | POA: Diagnosis not present

## 2015-05-03 DIAGNOSIS — N2581 Secondary hyperparathyroidism of renal origin: Secondary | ICD-10-CM | POA: Diagnosis not present

## 2015-05-26 DIAGNOSIS — E109 Type 1 diabetes mellitus without complications: Secondary | ICD-10-CM | POA: Diagnosis not present

## 2015-05-26 DIAGNOSIS — E7800 Pure hypercholesterolemia, unspecified: Secondary | ICD-10-CM | POA: Diagnosis not present

## 2015-06-02 DIAGNOSIS — E7800 Pure hypercholesterolemia, unspecified: Secondary | ICD-10-CM | POA: Diagnosis not present

## 2015-06-02 DIAGNOSIS — N183 Chronic kidney disease, stage 3 (moderate): Secondary | ICD-10-CM | POA: Diagnosis not present

## 2015-06-02 DIAGNOSIS — E109 Type 1 diabetes mellitus without complications: Secondary | ICD-10-CM | POA: Diagnosis not present

## 2015-07-25 ENCOUNTER — Ambulatory Visit: Payer: Federal, State, Local not specified - PPO | Admitting: Internal Medicine

## 2015-08-09 DIAGNOSIS — J209 Acute bronchitis, unspecified: Secondary | ICD-10-CM | POA: Diagnosis not present

## 2015-08-09 DIAGNOSIS — J208 Acute bronchitis due to other specified organisms: Secondary | ICD-10-CM | POA: Diagnosis not present

## 2015-08-16 ENCOUNTER — Encounter: Payer: Self-pay | Admitting: Internal Medicine

## 2015-08-16 ENCOUNTER — Ambulatory Visit (INDEPENDENT_AMBULATORY_CARE_PROVIDER_SITE_OTHER): Payer: Medicare Other | Admitting: Internal Medicine

## 2015-08-16 VITALS — BP 136/78 | HR 50 | Ht 71.0 in | Wt 175.2 lb

## 2015-08-16 DIAGNOSIS — J209 Acute bronchitis, unspecified: Secondary | ICD-10-CM | POA: Diagnosis not present

## 2015-08-16 NOTE — Patient Instructions (Signed)
Ok to see how you do using Symbicort just 2 puffs then rinse mouth, ONCE daily  Hydration and comfort meds as needed

## 2015-08-16 NOTE — Assessment & Plan Note (Signed)
Acute exacerbation apparently improving now. Combination of respiratory infection and environmental irritants. Plan-finished Biaxin, stay well-hydrated, OTC cough and comfort meds as needed, inhalers as directed

## 2015-08-16 NOTE — Progress Notes (Signed)
Subjective:    Patient ID: Robert Baker, male    DOB: January 31, 1951, 65 y.o.   MRN: [id number]  HPI 08/07/10- 60 yoM never smoker, followed for asthma and allergic rhinitis, complicated by DM. Last here- September 06, 2009 when he was noting some persistent cough despite Symbicort. Bradycardia was followed by [facility] cardiology and not an issue. . This Spring he has needed to use a rescue inhaler about twice weekly- ? Due to pollen. Eyes have itched more and has felt some throat congestion with a little morning phlegm. Not being wakened and little aware of wheeze.  He continues to be physically quite active.   08/07/11-60 yoM never smoker, followed for asthma and allergic rhinitis, complicated by DM. Chest congestion past month-unsure if related to pollen; Feels as he needs to go back up on Symbicort-having wheezing  He tried reducing Symbicort from 160-80, but had to increase Symbicort 80-4 puffs twice daily to keep asthma controlled. Failed Singulair years ago. Uses rescue inhaler about twice a week after increased exertion. Increased nasal congestion and rhinorrhea in the past month but does not use Flonase regularly.  08/07/12- 62 yoM never smoker, followed for asthma and allergic rhinitis, complicated by DM. FOLLOWS FOR: Increased congestion in throat area. Denies any SOb, wheezing, or cough at this time. Likes Dulera 100. Using rescue inhaler less than once per week. Some pollen rhinitis on Flonase.  08/10/13- 63 yoM never smoker, followed for asthma and allergic rhinitis, complicated by DM. FOLLOWS FOR: congestion caught in throat area alot lately. Has sneezing and/or cough when outside and pollen count is high or mowing yard. Found saline nasal rinse helpful in the past. Using Flonase intermittently because steady use causes nosebleeds.   04/01/2014 Acute OV -never smoker, Asthma and AR c/by DM-Type 1 on insulin   Complains of persistent cough, congestion, drainage for last several months.  Has on  /off yellow white mucus. No fever, hemoptysis, chest pain , orthopnea or edema.  Seen by PCP , few weeks ago, instructed to increase Dulera 3 puffs Twice daily  .  Currently on Singuair . Uses proair  Cough is worst symptom , wakes him up at night.   08/11/14- 64 yoM never smoker, followed for asthma and allergic rhinitis, complicated by DM. Follow up  slight congestion coughing up mucus yellow in color no headaches stays hydrated Bradycardia noted 47- normal for him per synopsis Persistent mild chest congestion. Little or no sputum but was bringing up some light green in January. Occasionally uses albuterol inhaler and continues Dulera twice daily. CXR 04/01/14 IMPRESSION: Minimal hyperinflation which could be related to history of asthma. No acute infiltrate. Electronically Signed  By: Ulyses Southward M.D.  On: 04/01/2014 17:12  02/11/2015-65 year old male never smoker followed for asthma and allergic rhinitis, complicated by DM FOLLOWS FOR: Pt states he has noticed that if laying on left side that he has wheezing but not if he lays on right side. Slight cough as well. Speech pathology Swallowing-08/25/2014-high likelihood of LPR, mild dysphagia without aspiration Around 3 or 4 AM most nights over the last 2 or 3 months, he has noticed wheezing only if he lies on his left side down. Using Symbicort twice daily. Feet swell a little. Office Spirometry 02/11/2015-within normal limits. FEV1/FVC 0.74, FEF 25-75 percent 76%  08/16/2015-65 year old male never smoker followed for asthma, allergic rhinitis, complicated by DM, LPR/dysphagia ACUTE VISIT:Recent travels [place] had issues with breathing-was treated for bronchitis and still on abx. Black oak pollen was "heavy" and around  campfire smoke where patient stayed. Pt noticed insect bite on his foot while there and then got sick about 1 week there after. Lost his voice as well and did not sleep more than about 2-3 hours each night but has gotten  better in past 2-3 days. Slowly improving on Biaxin after what sounds like a combination of a viral chest cold complicated by pollen and smoke. still has some hacking productive cough Office Spirometry 02/11/2015- WNL- FVC 91%, FEV1 87%, ratio 96%, FEF 25-75% 76% CXR 02/11/2015 IMPRESSION: Mild pulmonary hyperinflation. No infiltrate or collapse. No active process. Electronically Signed  By: Paulina FusiMark Shogry M.D.  On: 02/11/2015 11:49  ROS-see HPI   Negative unless "+" Constitutional:    weight loss, night sweats, fevers, chills, fatigue, lassitude. HEENT:    headaches, difficulty swallowing, tooth/dental problems, sore throat,       sneezing, itching, ear ache, nasal congestion, post nasal drip, snoring CV:    chest pain, orthopnea, PND, swelling in lower extremities, anasarca,                                                          dizziness, palpitations Resp:   +shortness of breath with exertion or at rest.                productive cough,   + non-productive cough, coughing up of blood.              change in color of mucus.  + wheezing.   Skin:    rash or lesions. GI:  No-   heartburn, indigestion, abdominal pain, nausea, vomiting,  GU:  MS:   joint pain, stiffness,  Neuro-     nothing unusual Psych:  change in mood or affect.  depression or anxiety.   memory loss.  OBJ- Physical Exam General- Alert, Oriented, Affect-appropriate, Distress- none acute, Skin- rash-none, lesions- none, excoriation- none Lymphadenopathy- none Head- atraumatic            Eyes- Gross vision intact, PERRLA, conjunctivae and secretions clear            Ears- Hearing aids+            Nose- Clear, no-Septal dev, mucus, polyps, erosion, perforation             Throat- Mallampati II , mucosa clear , drainage- none, tonsils- atrophic Neck- flexible , trachea midline, no stridor , thyroid nl, carotid no bruit Chest - symmetrical excursion , unlabored           Heart/CV- RRR , no murmur , no gallop  , no  rub, nl s1 s2                           - JVD- none , edema- none, stasis changes- none, varices- none           Lung- clear to P&A, wheeze + slight expiratory left chest, cough- none , dullness-none, rub- none           Chest wall-  Abd-  Br/ Gen/ Rectal- Not done, not indicated Extrem- cyanosis- none, clubbing, none, atrophy- none, strength- nl Neuro- grossly intact to observation

## 2015-08-25 ENCOUNTER — Ambulatory Visit: Payer: Federal, State, Local not specified - PPO | Admitting: Internal Medicine

## 2015-09-09 ENCOUNTER — Telehealth: Payer: Self-pay | Admitting: Internal Medicine

## 2015-09-09 MED ORDER — UMECLIDINIUM BROMIDE 62.5 MCG/INH IN AEPB: 1.0000 | INHALATION_SPRAY | Freq: Every day | RESPIRATORY_TRACT | Status: DC

## 2015-09-09 NOTE — Telephone Encounter (Signed)
Spoke with pt. States that his chest congestion and cough have returned. Cough is producing yellow mucus. Wheezing is also present. Denies chest tightness, SOB or fever. Symptoms started back when [place] visited [place] weeks ago. Symptoms have gotten worse over the past 4 days. Has been taking Mucinex OTC with minimal relief. Would like CY's recommendations. CY - please advise. Thanks.  Allergies  Allergen Reactions  . Chocolate Other (See Comments)    GI UPSET   Current Outpatient Prescriptions on File Prior to Visit  Medication Sig Dispense Refill  . albuterol (PROAIR HFA) 108 (90 Base) MCG/ACT inhaler 2 puffs every 4-6 hours as needed 3 Inhaler 3  . budesonide-formoterol (SYMBICORT) 160-4.5 MCG/ACT inhaler Inhale 2 puffs into the lungs 2 (two) times daily. 3 Inhaler 3  . Coenzyme Q10 (CO Q-10) 100 MG CAPS Take 3 capsules by mouth daily.     . fluticasone (FLONASE ALLERGY RELIEF) 50 MCG/ACT nasal spray Place 2 sprays into both nostrils daily.    Marland Kitchen. guaiFENesin (MUCINEX) 600 MG 12 hr tablet Take by mouth 2 (two) times daily.    . insulin aspart (NOVOLOG) 100 UNIT/ML injection Inject units based on blood sugar readings throughout the day (avg of 20 units a day)    . insulin glargine (LANTUS) 100 UNIT/ML injection Inject 6 Units into the skin daily.     . insulin lispro (HUMALOG) 100 UNIT/ML injection Inject 20 Units into the skin daily.     Marland Kitchen. loratadine-pseudoephedrine (CLARITIN-D 12-HOUR) 5-120 MG per tablet Take 1 tablet by mouth as needed.      . Multiple Vitamin (MULTIVITAMIN) tablet Take 1 tablet by mouth daily.    . pravastatin (PRAVACHOL) 20 MG tablet Take 20 mg by mouth daily.     No current facility-administered medications on file prior to visit.

## 2015-09-09 NOTE — Telephone Encounter (Signed)
Patient notified of Dr. Roxy CedarYoung's recommendations. Samples of Incruse left at front desk for patient to pick up. Patient aware.  Nothing further needed.

## 2015-09-09 NOTE — Telephone Encounter (Signed)
Try adding Mucinex-DM as an expectorant  Offer sample Incruse Ellipta inhaler  Inhale 1 puff, once daily. Add this to current inhaled meds to use up the sample. Watch for specific clues such as wheeze, fever, sore throat or purulent secretions.

## 2015-09-15 ENCOUNTER — Telehealth: Payer: Self-pay | Admitting: Internal Medicine

## 2015-09-15 MED ORDER — UMECLIDINIUM BROMIDE 62.5 MCG/INH IN AEPB: 1.0000 | INHALATION_SPRAY | Freq: Every day | RESPIRATORY_TRACT | Status: DC

## 2015-09-15 NOTE — Telephone Encounter (Signed)
Patient was given samples of Incruse and says it works good.  He wants refills sent to [facility]. Rx sent to Pharmacy. Patient aware.  Nothing further needed.

## 2015-09-19 ENCOUNTER — Other Ambulatory Visit (INDEPENDENT_AMBULATORY_CARE_PROVIDER_SITE_OTHER): Payer: Medicare Other

## 2015-09-19 ENCOUNTER — Ambulatory Visit (INDEPENDENT_AMBULATORY_CARE_PROVIDER_SITE_OTHER): Payer: Medicare Other | Admitting: Internal Medicine

## 2015-09-19 ENCOUNTER — Encounter: Payer: Self-pay | Admitting: Internal Medicine

## 2015-09-19 ENCOUNTER — Ambulatory Visit (INDEPENDENT_AMBULATORY_CARE_PROVIDER_SITE_OTHER)
Admission: RE | Admit: 2015-09-19 | Discharge: 2015-09-19 | Disposition: A | Discharge status: Home / Self Care | Payer: Medicare Other | Source: Ambulatory Visit | Attending: Internal Medicine | Admitting: Internal Medicine

## 2015-09-19 VITALS — BP 110/60 | HR 52 | Ht 71.0 in | Wt 179.6 lb

## 2015-09-19 DIAGNOSIS — J45901 Unspecified asthma with (acute) exacerbation: Secondary | ICD-10-CM | POA: Diagnosis not present

## 2015-09-19 DIAGNOSIS — R0602 Shortness of breath: Secondary | ICD-10-CM | POA: Diagnosis not present

## 2015-09-19 DIAGNOSIS — J449 Chronic obstructive pulmonary disease, unspecified: Secondary | ICD-10-CM

## 2015-09-19 DIAGNOSIS — J45909 Unspecified asthma, uncomplicated: Secondary | ICD-10-CM

## 2015-09-19 DIAGNOSIS — R05 Cough: Secondary | ICD-10-CM | POA: Diagnosis not present

## 2015-09-19 LAB — CBC WITH DIFFERENTIAL/PLATELET
BASOS ABS: 0.1 10*3/uL (ref 0.0–0.1)
Basophils Relative: 0.6 % (ref 0.0–3.0)
EOS PCT: 8.4 % — AB (ref 0.0–5.0)
Eosinophils Absolute: 0.8 10*3/uL — ABNORMAL HIGH (ref 0.0–0.7)
HCT: 37.7 % — ABNORMAL LOW (ref 39.0–52.0)
HEMOGLOBIN: 12.4 g/dL — AB (ref 13.0–17.0)
LYMPHS ABS: 1.9 10*3/uL (ref 0.7–4.0)
Lymphocytes Relative: 21.4 % (ref 12.0–46.0)
MCHC: 32.8 g/dL (ref 30.0–36.0)
MCV: 95.5 fl (ref 78.0–100.0)
MONOS PCT: 6.1 % (ref 3.0–12.0)
Monocytes Absolute: 0.6 10*3/uL (ref 0.1–1.0)
NEUTROS PCT: 63.5 % (ref 43.0–77.0)
Neutro Abs: 5.7 10*3/uL (ref 1.4–7.7)
Platelets: 293 10*3/uL (ref 150.0–400.0)
RBC: 3.95 Mil/uL — AB (ref 4.22–5.81)
RDW: 12.8 % (ref 11.5–15.5)
WBC: 9 10*3/uL (ref 4.0–10.5)

## 2015-09-19 LAB — NITRIC OXIDE: NITRIC OXIDE: 116

## 2015-09-19 MED ORDER — MOMETASONE FURO-FORMOTEROL FUM 200-5 MCG/ACT IN AERO: 2.0000 | INHALATION_SPRAY | Freq: Two times a day (BID) | RESPIRATORY_TRACT | Status: DC

## 2015-09-19 MED ORDER — MOMETASONE FURO-FORMOTEROL FUM 200-5 MCG/ACT IN AERO: INHALATION_SPRAY | RESPIRATORY_TRACT | Status: DC

## 2015-09-19 MED ORDER — AMOXICILLIN-POT CLAVULANATE 875-125 MG PO TABS: 1.0000 | ORAL_TABLET | Freq: Two times a day (BID) | ORAL | Status: DC

## 2015-09-19 NOTE — Progress Notes (Signed)
Subjective:    Patient ID: Robert Baker, male    DOB: 12/28/1950, 65 y.o.   MRN: [id number]  HPI M never smoker, followed for asthma and allergic rhinitis, complicated by DM.   02/11/2015-65 year old male never smoker followed for asthma and allergic rhinitis, complicated by DM FOLLOWS FOR: Pt states he has noticed that if laying on left side that he has wheezing but not if he lays on right side. Slight cough as well. Speech pathology Swallowing-08/25/2014-high likelihood of LPR, mild dysphagia without aspiration Around 3 or 4 AM most nights over the last 2 or 3 months, he has noticed wheezing only if he lies on his left side down. Using Symbicort twice daily. Feet swell a little. Office Spirometry 02/11/2015-within normal limits. FEV1/FVC 0.74, FEF 25-75 percent 76%  08/16/2015-65 year old male never smoker followed for asthma, allergic rhinitis, complicated by DM, LPR/dysphagia ACUTE VISIT:Recent travels [place] had issues with breathing-was treated for bronchitis and still on abx. Black oak pollen was "heavy" and around campfire smoke where patient stayed. Pt noticed insect bite on his foot while there and then got sick about 1 week there after. Lost his voice as well and did not sleep more than about 2-3 hours each night but has gotten better in past 2-3 days. Slowly improving on Biaxin after what sounds like a combination of a viral chest cold complicated by pollen and smoke. still has some hacking productive cough Office Spirometry 02/11/2015- WNL- FVC 91%, FEV1 87%, ratio 96%, FEF 25-75% 76% CXR 02/11/2015 IMPRESSION: Mild pulmonary hyperinflation. No infiltrate or collapse. No active process. Electronically Signed  By: Paulina FusiMark Shogry M.D.  On: 02/11/2015 11:49  09/19/2015-65 year old male never smoker followed for asthma, allergic rhinitis, Treated by DM, LPR/dysphagia pt c/o of prod cough w/yellowish mucus worsen @ night, wheezing, sob, cp/[id number] Has felt gradually worse  since last here after [place]. Wheeze, short of breath, cough productive yellow and brown plugs. Worse lying down. Denies fever or chills but sensitive to cold and keeping home thermostat 78. Incruse no help, rescue inhaler little help. Stuffy nose treated with sinus rinse. Traveling in 2 weeks then scheduled for TKR in August. FENO 116 H  ROS-see HPI   Negative unless "+" Constitutional:    weight loss, night sweats, fevers, chills, fatigue, lassitude. HEENT:    headaches, difficulty swallowing, tooth/dental problems, sore throat,       sneezing, itching, ear ache, +nasal congestion, post nasal drip, snoring CV:    chest pain, orthopnea, PND, swelling in lower extremities, anasarca,                                                          dizziness, palpitations Resp:   +shortness of breath with exertion or at rest.                +productive cough,   non-productive cough, coughing up of blood.              change in color of mucus.  + wheezing.   Skin:    rash or lesions. GI:  No-   heartburn, indigestion, abdominal pain, nausea, vomiting,  GU:  MS:   joint pain, stiffness,  Neuro-     nothing unusual Psych:  change in mood or affect.  depression or  anxiety.   memory loss.  OBJ- Physical Exam General- Alert, Oriented, Affect-appropriate, Distress- none acute, Skin- rash-none, lesions- none, excoriation- none Lymphadenopathy- none Head- atraumatic            Eyes- Gross vision intact, PERRLA, conjunctivae and secretions clear            Ears- Hearing aids+            Nose- Clear, no-Septal dev, mucus, polyps, erosion, perforation             Throat- Mallampati II , mucosa clear , drainage- none, tonsils- atrophic Neck- flexible , trachea midline, no stridor , thyroid nl, carotid no bruit Chest - symmetrical excursion , unlabored           Heart/CV- RRR , no murmur , no gallop  , no rub, nl s1 s2                           - JVD- none ,  edema- none, stasis changes- none, varices- none           Lung- clear to P&A, wheeze + slight expiratory left chest, cough- none , dullness-none, rub- none           Chest wall-  Abd-  Br/ Gen/ Rectal- Not done, not indicated Extrem- cyanosis- none, clubbing, none, atrophy- none, strength- nl Neuro- grossly intact to observation

## 2015-09-19 NOTE — Patient Instructions (Addendum)
Order- FENO      Dx asthma exacerbation  Order CXR       "  "  Neb xop 0.63  Order  - lab -    Sputum C&S, fungal, AFB, IgE, CBC w diiff  Sample and print script Dulera 200     Inhale 2 puffs then rinse mouth well, twice daily                 Ok to try off Incruse

## 2015-09-20 ENCOUNTER — Other Ambulatory Visit: Payer: Medicare Other

## 2015-09-20 DIAGNOSIS — J45901 Unspecified asthma with (acute) exacerbation: Secondary | ICD-10-CM

## 2015-09-20 LAB — IGE: IgE (Immunoglobulin E), Serum: 101 kU/L (ref <115)

## 2015-09-21 NOTE — Progress Notes (Signed)
Quick Note:  Called spoke with pt. Reviewed results and recs. Pt voiced understanding and had no further questions. ______ 

## 2015-09-22 ENCOUNTER — Telehealth: Payer: Self-pay | Admitting: Internal Medicine

## 2015-09-22 DIAGNOSIS — J209 Acute bronchitis, unspecified: Secondary | ICD-10-CM

## 2015-09-22 LAB — RESPIRATORY CULTURE OR RESPIRATORY AND SPUTUM CULTURE

## 2015-09-22 NOTE — Telephone Encounter (Signed)
Pt returned Robert Baker's call Advised of cxr results only as stated by CDY: Result Notes       Notes Recorded by Karleen HampshireMichelle P Gay, CMA on 09/21/2015 at 4:48 PM Left message for patient to call back. Dr. Maple HudsonYoung, received a call from lab, patient did not produce enough Sputum for Sputum Cx ordered. ------  Notes Recorded by Waymon Budgelinton D Young, MD on 09/20/2015 at 12:12 PM CXR - some over-inflation and thickened markings go along with asthma and bronchitis. There are some very soft shadows that might be fat deposits in the chest wall. The radiologist suggests we look again at those by repeating the CXR later.  Order- future outpatient CXR in 1 month    Dx asthmatic bronchitis, question lung nodule.   Pt voiced his understanding - does question about his upcoming knee surgery on 8.1.17 and if this finding on his cxr would affect his surgery.  CY please advise, thank you.  *cxr order placed for the last week in July.

## 2015-09-22 NOTE — Telephone Encounter (Signed)
As long as he doesn't feel that he has an obvious infection with fever and green sputum, I think he should be ok with surgery now.

## 2015-09-22 NOTE — Telephone Encounter (Signed)
Spoke with pt and advised of CY's response. Nothing further needed.

## 2015-10-05 DIAGNOSIS — E109 Type 1 diabetes mellitus without complications: Secondary | ICD-10-CM | POA: Diagnosis not present

## 2015-10-05 DIAGNOSIS — E7800 Pure hypercholesterolemia, unspecified: Secondary | ICD-10-CM | POA: Diagnosis not present

## 2015-10-13 DIAGNOSIS — M179 Osteoarthritis of knee, unspecified: Secondary | ICD-10-CM | POA: Diagnosis not present

## 2015-10-13 DIAGNOSIS — J45909 Unspecified asthma, uncomplicated: Secondary | ICD-10-CM | POA: Diagnosis not present

## 2015-10-13 DIAGNOSIS — E109 Type 1 diabetes mellitus without complications: Secondary | ICD-10-CM | POA: Diagnosis not present

## 2015-10-13 DIAGNOSIS — Z794 Long term (current) use of insulin: Secondary | ICD-10-CM | POA: Diagnosis not present

## 2015-10-14 DIAGNOSIS — N183 Chronic kidney disease, stage 3 (moderate): Secondary | ICD-10-CM | POA: Diagnosis not present

## 2015-10-14 DIAGNOSIS — E109 Type 1 diabetes mellitus without complications: Secondary | ICD-10-CM | POA: Diagnosis not present

## 2015-10-14 DIAGNOSIS — E7800 Pure hypercholesterolemia, unspecified: Secondary | ICD-10-CM | POA: Diagnosis not present

## 2015-10-17 ENCOUNTER — Ambulatory Visit (INDEPENDENT_AMBULATORY_CARE_PROVIDER_SITE_OTHER)
Admission: RE | Admit: 2015-10-17 | Discharge: 2015-10-17 | Disposition: A | Discharge status: Home / Self Care | Payer: Medicare Other | Source: Ambulatory Visit | Attending: Internal Medicine | Admitting: Internal Medicine

## 2015-10-17 DIAGNOSIS — J40 Bronchitis, not specified as acute or chronic: Secondary | ICD-10-CM | POA: Diagnosis not present

## 2015-10-17 DIAGNOSIS — J209 Acute bronchitis, unspecified: Secondary | ICD-10-CM | POA: Diagnosis not present

## 2015-10-17 NOTE — Patient Instructions (Signed)
Robert Baker  10/17/2015   Your procedure is scheduled on: Tuesday  10/25/2015  Report to [facility] Main  Entrance take [place]  elevators to 3rd floor to  Short Stay Center at  0515  AM.  Call this number if you have problems the morning of surgery [phone]   Remember: ONLY 1 PERSON MAY GO WITH YOU TO SHORT STAY TO GET  READY MORNING OF YOUR SURGERY.   Do not eat food or drink liquids :After Midnight.     Take these medicines the morning of surgery with A SIP OF WATER: use Dulera inhaler, use Incruse  Ellipta inhaler, use Flonase nasal spray and Albuterol inhaler if needed  DO NOT TAKE ANY DIABETIC MEDICATIONS DAY OF YOUR SURGERY                               You may not have any metal on your body including hair pins and              piercings  Do not wear jewelry, make-up, lotions, powders or perfumes, deodorant             Do not wear nail polish.  Do not shave  48 hours prior to surgery.              Men may shave face and neck.   Do not bring valuables to the hospital. [facility] IS NOT             RESPONSIBLE   FOR VALUABLES.  Contacts, dentures or bridgework may not be worn into surgery.  Leave suitcase in the car. After surgery it may be brought to your room.                 Please read over the following fact sheets you were given: _____________________________________________________________________             [facility] - Preparing for Surgery Before surgery, you can play an important role.  Because skin is not sterile, your skin needs to be as free of germs as possible.  You can reduce the number of germs on your skin by washing with CHG (chlorahexidine gluconate) soap before surgery.  CHG is an antiseptic cleaner which kills germs and bonds with the skin to continue killing germs even after washing. Please DO NOT use if you have an allergy to CHG or antibacterial soaps.  If your skin becomes reddened/irritated stop using the CHG and inform  your nurse when you arrive at Short Stay. Do not shave (including legs and underarms) for at least 48 hours prior to the first CHG shower.  You may shave your face/neck. Please follow these instructions carefully:  1.  Shower with CHG Soap the night before surgery and the  morning of Surgery.  2.  If you choose to wash your hair, wash your hair first as usual with your  normal  shampoo.  3.  After you shampoo, rinse your hair and body thoroughly to remove the  shampoo.                           4.  Use CHG as you would any other liquid soap.  You can apply chg directly  to the skin and wash  Gently with a scrungie or clean washcloth.  5.  Apply the CHG Soap to your body ONLY FROM THE NECK DOWN.   Do not use on face/ open                           Wound or open sores. Avoid contact with eyes, ears mouth and genitals (private parts).                       Wash face,  Genitals (private parts) with your normal soap.             6.  Wash thoroughly, paying special attention to the area where your surgery  will be performed.  7.  Thoroughly rinse your body with warm water from the neck down.  8.  DO NOT shower/wash with your normal soap after using and rinsing off  the CHG Soap.                9.  Pat yourself dry with a clean towel.            10.  Wear clean pajamas.            11.  Place clean sheets on your bed the night of your first shower and do not  sleep with pets. Day of Surgery : Do not apply any lotions/deodorants the morning of surgery.  Please wear clean clothes to the hospital/surgery center.  FAILURE TO FOLLOW THESE INSTRUCTIONS MAY RESULT IN THE CANCELLATION OF YOUR SURGERY PATIENT SIGNATURE_________________________________  NURSE SIGNATURE__________________________________  ________________________________________________________________________   Robert Baker  An incentive spirometer is a tool that can help keep your lungs clear and active. This  tool measures how well you are filling your lungs with each breath. Taking long deep breaths may help reverse or decrease the chance of developing breathing (pulmonary) problems (especially infection) following:  A long period of time when you are unable to move or be active. BEFORE THE PROCEDURE   If the spirometer includes an indicator to show your best effort, your nurse or respiratory therapist will set it to a desired goal.  If possible, sit up straight or lean slightly forward. Try not to slouch.  Hold the incentive spirometer in an upright position. INSTRUCTIONS FOR USE  1. Sit on the edge of your bed if possible, or sit up as far as you can in bed or on a chair. 2. Hold the incentive spirometer in an upright position. 3. Breathe out normally. 4. Place the mouthpiece in your mouth and seal your lips tightly around it. 5. Breathe in slowly and as deeply as possible, raising the piston or the ball toward the top of the column. 6. Hold your breath for 3-5 seconds or for as long as possible. Allow the piston or ball to fall to the bottom of the column. 7. Remove the mouthpiece from your mouth and breathe out normally. 8. Rest for a few seconds and repeat Steps 1 through 7 at least 10 times every 1-2 hours when you are awake. Take your time and take a few normal breaths between deep breaths. 9. The spirometer may include an indicator to show your best effort. Use the indicator as a goal to work toward during each repetition. 10. After each set of 10 deep breaths, practice coughing to be sure your lungs are clear. If you have an incision (the cut made at the time of surgery),  support your incision when coughing by placing a pillow or rolled up towels firmly against it. Once you are able to get out of bed, walk around indoors and cough well. You may stop using the incentive spirometer when instructed by your caregiver.  RISKS AND COMPLICATIONS  Take your time so you do not get dizzy or  light-headed.  If you are in pain, you may need to take or ask for pain medication before doing incentive spirometry. It is harder to take a deep breath if you are having pain. AFTER USE  Rest and breathe slowly and easily.  It can be helpful to keep track of a log of your progress. Your caregiver can provide you with a simple table to help with this. If you are using the spirometer at home, follow these instructions: [place] IF:   You are having difficultly using the spirometer.  You have trouble using the spirometer as often as instructed.  Your pain medication is not giving enough relief while using the spirometer.  You develop fever of 100.5 F (38.1 C) or higher. SEEK IMMEDIATE MEDICAL CARE IF:   You cough up bloody sputum that had not been present before.  You develop fever of 102 F (38.9 C) or greater.  You develop worsening pain at or near the incision site. MAKE SURE YOU:   Understand these instructions.  Will watch your condition.  Will get help right away if you are not doing well or get worse. Document Released: 07/23/2006 Document Revised: 06/04/2011 Document Reviewed: 09/23/2006 Exit[employer] Patient Information 2014 Exit[employer], [place].   ________________________________________________________________________  WHAT IS A BLOOD TRANSFUSION? Blood Transfusion Information  A transfusion is the replacement of blood or some of its parts. Blood is made up of multiple cells which provide different functions.  Red blood cells carry oxygen and are used for blood loss replacement.  White blood cells fight against infection.  Platelets control bleeding.  Plasma helps clot blood.  Other blood products are available for specialized needs, such as hemophilia or other clotting disorders. BEFORE THE TRANSFUSION  Who gives blood for transfusions?   Healthy volunteers who are fully evaluated to make sure their blood is safe. This is blood bank  blood. Transfusion therapy is the safest it has ever been in the practice of medicine. Before blood is taken from a donor, a complete history is taken to make sure that person has no history of diseases nor engages in risky social behavior (examples are intravenous drug use or sexual activity with multiple partners). The donor's travel history is screened to minimize risk of transmitting infections, such as malaria. The donated blood is tested for signs of infectious diseases, such as HIV and hepatitis. The blood is then tested to be sure it is compatible with you in order to minimize the chance of a transfusion reaction. If you or a relative donates blood, this is often done in anticipation of surgery and is not appropriate for emergency situations. It takes many days to process the donated blood. RISKS AND COMPLICATIONS Although transfusion therapy is very safe and saves many lives, the main dangers of transfusion include:   Getting an infectious disease.  Developing a transfusion reaction. This is an allergic reaction to something in the blood you were given. Every precaution is taken to prevent this. The decision to have a blood transfusion has been considered carefully by your caregiver before blood is given. Blood is not given unless the benefits outweigh the risks. AFTER THE TRANSFUSION  Right after receiving a blood transfusion, you will usually feel much better and more energetic. This is especially true if your red blood cells have gotten low (anemic). The transfusion raises the level of the red blood cells which carry oxygen, and this usually causes an energy increase.  The nurse administering the transfusion will monitor you carefully for complications. HOME CARE INSTRUCTIONS  No special instructions are needed after a transfusion. You may find your energy is better. Speak with your caregiver about any limitations on activity for underlying diseases you may have. SEEK MEDICAL CARE IF:    Your condition is not improving after your transfusion.  You develop redness or irritation at the intravenous (IV) site. SEEK IMMEDIATE MEDICAL CARE IF:  Any of the following symptoms occur over the next 12 hours:  Shaking chills.  You have a temperature by mouth above 102 F (38.9 C), not controlled by medicine.  Chest, back, or muscle pain.  People around you feel you are not acting correctly or are confused.  Shortness of breath or difficulty breathing.  Dizziness and fainting.  You get a rash or develop hives.  You have a decrease in urine output.  Your urine turns a dark color or changes to pink, red, or brown. Any of the following symptoms occur over the next 10 days:  You have a temperature by mouth above 102 F (38.9 C), not controlled by medicine.  Shortness of breath.  Weakness after normal activity.  The white part of the eye turns yellow (jaundice).  You have a decrease in the amount of urine or are urinating less often.  Your urine turns a dark color or changes to pink, red, or brown. Document Released: 03/09/2000 Document Revised: 06/04/2011 Document Reviewed: 10/27/2007 [facility] Patient Information 2014 [place], [place].  _______________________________________________________________________

## 2015-10-19 ENCOUNTER — Inpatient Hospital Stay (HOSPITAL_COMMUNITY)
Admission: RE | Admit: 2015-10-19 | Discharge: 2015-10-19 | Disposition: A | Discharge status: Home / Self Care | Payer: Medicare Other | Source: Ambulatory Visit

## 2015-10-19 NOTE — Progress Notes (Signed)
10/14/2015- Last office note from Dr. Dorisann Frames with labs-BMP, HHGA1C, STATIN panel,UA microalbumin / creatinine ratio on chart.

## 2015-10-20 NOTE — H&P (Signed)
TOTAL KNEE ADMISSION H&P  Patient is being admitted for right total knee arthroplasty.  Subjective:  Chief Complaint:   Right knee primary OA / pain  HPI: Robert Baker, 65 y.o. male, has a history of pain and functional disability in the right knee due to arthritis and has failed non-surgical conservative treatments for greater than 12 weeks to include NSAID's and/or analgesics, corticosteriod injections and activity modification.  Onset of symptoms was gradual, starting 15+ years ago with gradually worsening course since that time. The patient noted prior procedures on the knee to include  arthroscopy on the right knee(s).  Patient currently rates pain in the right knee(s) at 5 out of 10 with activity. Patient has night pain, worsening of pain with activity and weight bearing, pain that interferes with activities of daily living, pain with passive range of motion, crepitus and joint swelling.  Patient has evidence of periarticular osteophytes and joint space narrowing by imaging studies.  There is no active infection.  Risks, benefits and expectations were discussed with the patient.  Risks including but not limited to the risk of anesthesia, blood clots, nerve damage, blood vessel damage, failure of the prosthesis, infection and up to and including death.  Patient understand the risks, benefits and expectations and wishes to proceed with surgery.  PCP: Dorisann Frames, MD  D/C Plans:      Home  Post-op Meds:       No Rx given  Tranexamic Acid:      To be given - IV   Decadron:      Is to be given  FYI:     Xarelto (unable to use ASA)  Norco    Patient Active Problem List   Diagnosis Date Noted  . Asthmatic bronchitis , chronic (HCC) 08/07/2010  . DIABETES MELLITUS 09/08/2007  . ASTHMATIC BRONCHITIS, ACUTE 09/08/2007  . Seasonal allergic rhinitis 09/08/2007   Past Medical History:  Diagnosis Date  . Arthritis    left hand  . Asthma    daily and prn inhalers  . Dental crowns present     also dental caps  . 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 High cholesterol   . History of vesicoureteral reflux   . Immature cataract   . Insulin dependent diabetes mellitus (HCC)   . Nasal congestion 01/07/2014  . Right bicipital tenosynovitis 12/2013  . Rotator cuff rupture, complete 12/2013   right shoulder  . Skin aging    states has fragile skin    Past Surgical History:  Procedure Laterality Date  . KNEE ARTHROSCOPY Bilateral   . SHOULDER ARTHROSCOPY Bilateral   . SHOULDER ARTHROSCOPY WITH SUBACROMIAL DECOMPRESSION, ROTATOR CUFF REPAIR AND BICEP TENDON REPAIR Right 01/14/2014   Procedure: RIGHT SHOULDER ARTHROSCOPY DEBRIDEMENT,DISTAL CLAVICLE EXCISION,ACROMIOPLASTY,ROTATOR CUFF REPAIR,BICEPS TENODESIS;  Surgeon: Loreta Ave, MD;  Location: [facility];  Service: Orthopedics;  Laterality: Right;  . TONSILLECTOMY  age 59  . WRIST SURGERY Right    navicular fx.    No prescriptions prior to admission.   Allergies  Allergen Reactions  . Chocolate Nausea Only and Other (See Comments)    GI UPSET    Social History  Substance Use Topics  . Smoking status: Never Smoker  . Smokeless tobacco: Never Used  . Alcohol use Yes     Comment: occasionally    Family History  Problem Relation Age of Onset  . Breast cancer Mother   . Other Father     respiratory failure >  pulmonary edema  . Parkinsonism Brother      Review of Systems  Constitutional: Negative.   HENT: Positive for hearing loss and tinnitus.   Eyes: Negative.   Respiratory: Negative.   Cardiovascular: Negative.   Gastrointestinal: Negative.   Genitourinary: Negative.   Musculoskeletal: Positive for back pain and joint pain.  Skin: Negative.   Neurological: Negative.   Endo/Heme/Allergies: Negative.   Psychiatric/Behavioral: Negative.     Objective:  Physical Exam  Constitutional: He is oriented to person, place, and time. He appears well-developed.   HENT:  Head: Normocephalic.  Eyes: Pupils are equal, round, and reactive to light.  Neck: Neck supple. No JVD present. No tracheal deviation present. No thyromegaly present.  Cardiovascular: Normal rate, regular rhythm, normal heart sounds and intact distal pulses.   Respiratory: Effort normal and breath sounds normal. No respiratory distress. He has no wheezes.  GI: Soft. There is no tenderness. There is no guarding.  Musculoskeletal:       Right knee: He exhibits decreased range of motion, swelling and bony tenderness. He exhibits no ecchymosis, no deformity, no laceration and no erythema. Tenderness found.  Lymphadenopathy:    He has no cervical adenopathy.  Neurological: He is alert and oriented to person, place, and time.  Skin: Skin is warm and dry.  Psychiatric: He has a normal mood and affect.      Labs:  Estimated body mass index is 25.05 kg/m as calculated from the following:   Height as of 09/19/15: 5\' 11"  (1.803 m).   Weight as of 09/19/15: 81.5 kg (179 lb 9.6 oz).   Imaging Review Plain radiographs demonstrate severe degenerative joint disease of the right knee(s). The bone quality appears to be good for age and reported activity level.  Assessment/Plan:  End stage arthritis, right knee   The patient history, physical examination, clinical judgment of the provider and imaging studies are consistent with end stage degenerative joint disease of the right knee(s) and total knee arthroplasty is deemed medically necessary. The treatment options including medical management, injection therapy arthroscopy and arthroplasty were discussed at length. The risks and benefits of total knee arthroplasty were presented and reviewed. The risks due to aseptic loosening, infection, stiffness, patella tracking problems, thromboembolic complications and other imponderables were discussed. The patient acknowledged the explanation, agreed to proceed with the plan and consent was signed.  Patient is being admitted for inpatient treatment for surgery, pain control, PT, OT, prophylactic antibiotics, VTE prophylaxis, progressive ambulation and ADL's and discharge planning. The patient is planning to be discharged home.     Anastasio Auerbach Toshiro Hanken   PA-C  10/20/2015, 9:43 AM

## 2015-11-03 DIAGNOSIS — E113391 Type 2 diabetes mellitus with moderate nonproliferative diabetic retinopathy without macular edema, right eye: Secondary | ICD-10-CM | POA: Diagnosis not present

## 2015-11-03 DIAGNOSIS — H25012 Cortical age-related cataract, left eye: Secondary | ICD-10-CM | POA: Diagnosis not present

## 2015-11-03 DIAGNOSIS — H2511 Age-related nuclear cataract, right eye: Secondary | ICD-10-CM | POA: Diagnosis not present

## 2015-11-03 DIAGNOSIS — H2512 Age-related nuclear cataract, left eye: Secondary | ICD-10-CM | POA: Diagnosis not present

## 2015-11-03 DIAGNOSIS — H25011 Cortical age-related cataract, right eye: Secondary | ICD-10-CM | POA: Diagnosis not present

## 2015-11-03 DIAGNOSIS — E113392 Type 2 diabetes mellitus with moderate nonproliferative diabetic retinopathy without macular edema, left eye: Secondary | ICD-10-CM | POA: Diagnosis not present

## 2015-11-04 LAB — FUNGUS CULTURE W SMEAR

## 2015-11-04 LAB — AFB CULTURE WITH SMEAR (NOT AT ARMC)

## 2015-11-10 ENCOUNTER — Other Ambulatory Visit: Payer: Self-pay | Admitting: Gastroenterology

## 2015-11-10 DIAGNOSIS — Z8371 Family history of colonic polyps: Secondary | ICD-10-CM | POA: Diagnosis not present

## 2015-11-10 DIAGNOSIS — R1312 Dysphagia, oropharyngeal phase: Secondary | ICD-10-CM | POA: Diagnosis not present

## 2015-11-14 ENCOUNTER — Other Ambulatory Visit (HOSPITAL_COMMUNITY): Payer: Medicare Other

## 2015-11-15 ENCOUNTER — Ambulatory Visit
Admission: RE | Admit: 2015-11-15 | Discharge: 2015-11-15 | Disposition: A | Discharge status: Home / Self Care | Payer: Medicare Other | Source: Ambulatory Visit | Attending: Gastroenterology | Admitting: Gastroenterology

## 2015-11-15 DIAGNOSIS — M9420 Chondromalacia, unspecified site: Secondary | ICD-10-CM | POA: Diagnosis not present

## 2015-11-15 DIAGNOSIS — K449 Diaphragmatic hernia without obstruction or gangrene: Secondary | ICD-10-CM | POA: Diagnosis not present

## 2015-11-15 DIAGNOSIS — K219 Gastro-esophageal reflux disease without esophagitis: Secondary | ICD-10-CM | POA: Diagnosis not present

## 2015-11-15 DIAGNOSIS — M25561 Pain in right knee: Secondary | ICD-10-CM | POA: Diagnosis not present

## 2015-11-15 DIAGNOSIS — M1711 Unilateral primary osteoarthritis, right knee: Secondary | ICD-10-CM | POA: Diagnosis not present

## 2015-11-15 DIAGNOSIS — R1312 Dysphagia, oropharyngeal phase: Secondary | ICD-10-CM

## 2015-11-21 ENCOUNTER — Inpatient Hospital Stay (HOSPITAL_COMMUNITY): Admission: RE | Admit: 2015-11-21 | Payer: Medicare Other | Source: Ambulatory Visit | Admitting: Orthopedic Surgery

## 2015-11-21 ENCOUNTER — Encounter (HOSPITAL_COMMUNITY): Admission: RE | Payer: Self-pay | Source: Ambulatory Visit

## 2015-11-21 SURGERY — ARTHROPLASTY, KNEE, TOTAL
Anesthesia: Spinal | Site: Knee | Laterality: Right

## 2015-11-24 DIAGNOSIS — M1711 Unilateral primary osteoarthritis, right knee: Secondary | ICD-10-CM | POA: Diagnosis not present

## 2015-11-24 DIAGNOSIS — M25561 Pain in right knee: Secondary | ICD-10-CM | POA: Diagnosis not present

## 2015-12-13 DIAGNOSIS — M1711 Unilateral primary osteoarthritis, right knee: Secondary | ICD-10-CM | POA: Diagnosis not present

## 2016-01-03 NOTE — Assessment & Plan Note (Signed)
Some nasal stuffiness but no headache or obvious sinusitis Plan-continue saline rinse

## 2016-01-03 NOTE — Assessment & Plan Note (Addendum)
Sustained exacerbation With markedly elevated FENO and history of allergic asthma, need to consider possible AB PA Plan-CXR, nebulizer treatments Xopenex, CBC with differential, total IgE, Dulera 200. Consider specific antibody evaluation for Aspergillus if initial labs are consistent. Sputum cultures. Hoping to avoid systemic steroids because of his diabetes.

## 2016-01-20 ENCOUNTER — Encounter: Payer: Self-pay | Admitting: Internal Medicine

## 2016-01-20 ENCOUNTER — Ambulatory Visit (INDEPENDENT_AMBULATORY_CARE_PROVIDER_SITE_OTHER): Payer: Medicare Other | Admitting: Internal Medicine

## 2016-01-20 DIAGNOSIS — J449 Chronic obstructive pulmonary disease, unspecified: Secondary | ICD-10-CM

## 2016-01-20 DIAGNOSIS — J3089 Other allergic rhinitis: Secondary | ICD-10-CM | POA: Diagnosis not present

## 2016-01-20 DIAGNOSIS — J302 Other seasonal allergic rhinitis: Secondary | ICD-10-CM | POA: Diagnosis not present

## 2016-01-20 NOTE — Patient Instructions (Signed)
Continue present meds  Sample Spiriva Respimat     Inhale 2 puffs once daily See if this helps with   Please call as needed

## 2016-01-20 NOTE — Progress Notes (Signed)
Subjective:    Patient ID: Robert Baker, male    DOB: February 15, 1951, 65 y.o.   MRN: [id number]  HPI M never smoker, followed for asthma and allergic rhinitis, complicated by DM. Speech pathology Swallowing-08/25/2014-high likelihood of LPR Office Spirometry 02/11/2015- WNL- FVC 91%, FEV1 87%, ratio 96%, FEF 25-75% 76% FENO 09/19/15- 116 H  08/16/2015-65 year old male never smoker followed for asthma, allergic rhinitis, complicated by DM, LPR/dysphagia ACUTE VISIT:Recent [place] had issues with breathing-was treated for bronchitis and still on abx. [place] oak pollen was "heavy" and around campfire smoke where patient stayed. Pt noticed insect bite on his foot while there and then got sick about 1 week there after. Lost his voice as well and did not sleep more than about 2-3 hours each night but has gotten better in past 2-3 days. Slowly improving on Biaxin after what sounds like a combination of a viral chest cold complicated by pollen and smoke. still has some hacking productive cough CXR 02/11/2015 IMPRESSION: Mild pulmonary hyperinflation. No infiltrate or collapse. No active process. Electronically Signed  By: Paulina FusiMark Shogry M.D.  On: 02/11/2015 11:49  09/19/2015-65 year old male never smoker followed for asthma, allergic rhinitis, Treated by DM, LPR/dysphagia/GERD pt c/o of prod cough w/yellowish mucus worsen @ night, wheezing, sob, cp/[id number] Has felt gradually worse since last here after getting sick while working at [employer] last spring. Wheeze, short of breath, cough productive yellow and brown plugs. Worse lying down. Denies fever or chills but sensitive to cold and keeping home thermostat 78. Incruse no help, rescue inhaler little help. Stuffy nose treated with sinus rinse. Traveling in 2 weeks then scheduled for TKR in August. FENO 116 H  01/20/2016-65 year old male never smoker followed for asthma, allergic rhinitis, complicated by DM, LPR/dysphagia/GERD FOLLOWS  FOR: Pt states his breathing is not bad but has fair amount of congeston(slight chest but mainly nasal). Has not had any Mucinex in past week-trying to not use it as much.  Notices nasal congestion and some chest congestion especially [place] Using rescue inhaler once or twice daily. CXR 10/17/2015-NAD  ROS-see HPI   Negative unless "+" Constitutional:    weight loss, night sweats, fevers, chills, fatigue, lassitude. HEENT:    headaches, difficulty swallowing, tooth/dental problems, sore throat,       sneezing, itching, ear ache, +nasal congestion, post nasal drip, snoring CV:    chest pain, orthopnea, PND, swelling in lower extremities, anasarca,                                                          dizziness, palpitations Resp:   +shortness of breath with exertion or at rest.                productive cough,   non-productive cough, coughing up of blood.              change in color of mucus.  + wheezing.   Skin:    rash or lesions. GI:  No-   heartburn, indigestion, abdominal pain, nausea, vomiting,  GU:  MS:   joint pain, stiffness,  Neuro-     nothing unusual Psych:  change in mood or affect.  depression or anxiety.   memory loss.  OBJ- Physical Exam General- Alert, Oriented, Affect-appropriate, Distress-  none acute, Skin- rash-none, lesions- none, excoriation- none Lymphadenopathy- none Head- atraumatic            Eyes- Gross vision intact, PERRLA, conjunctivae and secretions clear            Ears- Hearing aids+            Nose- Clear, no-Septal dev, mucus, polyps, erosion, perforation             Throat- Mallampati II , mucosa clear , drainage- none, tonsils- atrophic Neck- flexible , trachea midline, no stridor , thyroid nl, carotid no bruit Chest - symmetrical excursion , unlabored           Heart/CV- RRR , no murmur , no gallop  , no rub, nl s1 s2                           - JVD- none , edema- none, stasis  changes- none, varices- none           Lung-  wheeze + mild, cough- none , dullness-none, rub- none           Chest wall-  Abd-  Br/ Gen/ Rectal- Not done, not indicated Extrem- cyanosis- none, clubbing, none, atrophy- none, strength- nl Neuro- grossly intact to observation

## 2016-01-22 NOTE — Assessment & Plan Note (Signed)
Sustained modest exacerbation this summer after respiratory infection earlier this year. I don't think he has an ongoing chest infection. Asthma moderate persistent. We discussed role of inhaled steroids considering his diabetes. Plan-sample Spiriva Respimat for trial

## 2016-01-22 NOTE — Assessment & Plan Note (Addendum)
Nasal congestion without purulent discharge or headache, seems to be more perennial now but with environmental aggravation. Discussed antihistamines and available nasal sprays He would need to go to another office for evaluation for allergy shots Consider CT sinuses

## 2016-01-27 ENCOUNTER — Telehealth: Payer: Self-pay | Admitting: Internal Medicine

## 2016-01-27 MED ORDER — TIOTROPIUM BROMIDE MONOHYDRATE 2.5 MCG/ACT IN AERS: 2.0000 | INHALATION_SPRAY | Freq: Every day | RESPIRATORY_TRACT | 3 refills | Status: DC

## 2016-01-27 NOTE — Telephone Encounter (Signed)
Called and spoke with pt and he is aware of refill of the spiriva that has been sent to the mail order pharmacy.  Nothing further is needed.

## 2016-04-13 ENCOUNTER — Telehealth: Payer: Self-pay | Admitting: Acute Care

## 2016-04-13 ENCOUNTER — Ambulatory Visit (INDEPENDENT_AMBULATORY_CARE_PROVIDER_SITE_OTHER): Payer: Medicare Other | Admitting: Acute Care

## 2016-04-13 ENCOUNTER — Ambulatory Visit (INDEPENDENT_AMBULATORY_CARE_PROVIDER_SITE_OTHER)
Admission: RE | Admit: 2016-04-13 | Discharge: 2016-04-13 | Disposition: A | Discharge status: Home / Self Care | Payer: Medicare Other | Source: Ambulatory Visit | Attending: Acute Care | Admitting: Acute Care

## 2016-04-13 ENCOUNTER — Other Ambulatory Visit: Payer: Self-pay | Admitting: Acute Care

## 2016-04-13 ENCOUNTER — Encounter: Payer: Self-pay | Admitting: Acute Care

## 2016-04-13 VITALS — BP 120/72 | HR 66 | Temp 100.8°F | Ht 71.0 in | Wt 180.0 lb

## 2016-04-13 DIAGNOSIS — R509 Fever, unspecified: Secondary | ICD-10-CM | POA: Diagnosis not present

## 2016-04-13 DIAGNOSIS — R05 Cough: Secondary | ICD-10-CM | POA: Diagnosis not present

## 2016-04-13 DIAGNOSIS — J181 Lobar pneumonia, unspecified organism: Secondary | ICD-10-CM

## 2016-04-13 DIAGNOSIS — R0602 Shortness of breath: Secondary | ICD-10-CM

## 2016-04-13 DIAGNOSIS — J189 Pneumonia, unspecified organism: Secondary | ICD-10-CM | POA: Insufficient documentation

## 2016-04-13 DIAGNOSIS — J449 Chronic obstructive pulmonary disease, unspecified: Secondary | ICD-10-CM | POA: Diagnosis not present

## 2016-04-13 LAB — NITRIC OXIDE: NITRIC OXIDE: 15

## 2016-04-13 LAB — POCT INFLUENZA A/B
INFLUENZA B, POC: NEGATIVE
Influenza A, POC: NEGATIVE

## 2016-04-13 MED ORDER — AZITHROMYCIN 250 MG PO TABS: ORAL_TABLET | ORAL | 0 refills | Status: DC

## 2016-04-13 MED ORDER — PREDNISONE 10 MG PO TABS: ORAL_TABLET | ORAL | 0 refills | Status: DC

## 2016-04-13 MED ORDER — LEVOFLOXACIN 500 MG PO TABS: 500.0000 mg | ORAL_TABLET | Freq: Every day | ORAL | 0 refills | Status: DC

## 2016-04-13 NOTE — Assessment & Plan Note (Signed)
Levaquin 500 mg once daily x 7 days Rest Fluids Mucinex Follow up in 2 weeks with Dr. Maple HudsonYoung or NP Please contact office for sooner follow up if symptoms do not improve or worsen or seek emergency care

## 2016-04-13 NOTE — Patient Instructions (Addendum)
It is nice to meet you today. We will check a CXR Your flu test today was negative. FENO in the office today. Prednisone taper; 10 mg tablets: 4 tabs x 2 days, 3 tabs x 2 days, 2 tabs x 2 days 1 tab x 2 days then stop. Use Mucinex 1200 mg once daily with a full glass of water. Continue  your Elwin SleightDulera and Spiriva as you have been doing. Continue your albuterol as needed for shortness of breath or wheezing. Z-pack  Probiotic with Antibiotic Follow up in 2 weeks with Dr. Maple HudsonYoung or me to ensure improvement Please contact office for sooner follow up if symptoms do not improve or worsen or seek emergency care

## 2016-04-13 NOTE — Assessment & Plan Note (Signed)
Productive Cough with wheezing and dyspnea with exertion Fever, body aches Plan: CXR Your flu test today was negative. FENO in the office today.( 15) Prednisone taper; 10 mg tablets: 4 tabs x 2 days, 3 tabs x 2 days, 2 tabs x 2 days 1 tab x 2 days then stop. This will affect blood sugars. Please monitor CBG's carefully Use Mucinex 1200 mg once daily with a full glass of water. ( Secretion mobilization) Continue  your Dulera and Spiriva as you have been doing. Continue your albuterol as needed for shortness of breath or wheezing. Levaquin 500 mg once daily x 7 days Probiotic with Antibiotic Follow up in 2 weeks with Dr. Maple HudsonYoung or me to ensure improvement Follow up sooner if not improving on antibiotics. Please contact office for sooner follow up if symptoms do not improve or worsen or seek emergency care

## 2016-04-13 NOTE — Telephone Encounter (Signed)
I have called the patient. I let him know his CXR indicated a pneumonia in the right middle lobe. I told his I have called in a Levaquin prescription for him. I have told him not to take the Z-Pack. I told him to rest and drink plenty of fluids. He has a follow up appointment in 2 weeks. I have told him if he does not start to improve quickly on the antibiotic, to call and come in sooner for follow up, or to seek emergency medical care. He verbalized understanding. Prescription has been called in to his pharmacy.

## 2016-04-13 NOTE — Progress Notes (Signed)
History of Present Illness Robert Baker is a 66 y.o. male with asthma,  allergic rhinitis and bronchitis followed by Dr. Maple Hudson.  HPI M never smoker, followed for asthma and allergic rhinitis, complicated by DM. Speech pathology Swallowing-08/25/2014-high likelihood of LPR Office Spirometry 02/11/2015- WNL- FVC 91%, FEV1 87%, ratio 96%, FEF 25-75% 76% FENO 09/19/15- 116 H  04/13/2016 Acute OV: Pt. 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 Marland Kitchen He also  notes that he is sleeping a lot. Upon entering the office he was checked for fever, and had a temp of 100.8 orally.He states he has had body aches. He states his cough is positive for clear to white secretions.He is compliant with his Dulrea and Spiriva. He is using his albuterol inhaler once or twice daily.He states he has been wheezing.He feels this is more of a bronchitis flare. With fever, will check flu PCA and CXR. He denies chest pain, orthopnea or hemoptysis.  Tests: 04/13/2016>> CXR IMPRESSION: Hazy right middle lobe airspace disease which may reflect atelectasis versus pneumonia. Followup PA and lateral chest X-ray is recommended in 3-4 weeks following trial of antibiotic therapy to ensure resolution and exclude underlying malignancy.  04/13/2016>> FENO>> 15  04/13/2016>> Flu PCR>>Negative  Past medical hx Past Medical History:  Diagnosis Date  . Arthritis    left hand  . Asthma    daily and prn inhalers  . Dental crowns present    also dental caps  . 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 High cholesterol   . History of vesicoureteral reflux   . Immature cataract   . Insulin dependent diabetes mellitus (HCC)   . Nasal congestion 01/07/2014  . Right bicipital tenosynovitis 12/2013  . Rotator cuff rupture, complete  12/2013   right shoulder  . Skin aging    states has fragile skin     Past surgical hx, Family hx, Social hx all reviewed.  Current Outpatient Prescriptions on File Prior to Visit  Medication Sig  . albuterol (PROAIR HFA) 108 (90 Base) MCG/ACT inhaler 2 puffs every 4-6 hours as needed (Patient taking differently: Inhale 2 puffs into the lungs every 4 (four) hours as needed for wheezing or shortness of breath. 2 puffs every 4-6 hours as needed)  . Coenzyme Q10 (CO Q-10) 100 MG CAPS Take 3 capsules by mouth daily.   . diclofenac sodium (VOLTAREN) 1 % GEL apply 2-4 grams three times a day to four times a day to affected area OF FOOT as directed  . fluticasone (FLONASE ALLERGY RELIEF) 50 MCG/ACT nasal spray Place 2 sprays into both nostrils daily.  Marland Kitchen guaiFENesin (MUCINEX) 600 MG 12 hr tablet Take 600 mg by mouth 2 (two) times daily.   . insulin aspart (NOVOLOG) 100 UNIT/ML injection Inject units based on blood sugar readings throughout the day (avg of 20 units a day)  . insulin glargine (LANTUS) 100 UNIT/ML injection Inject 6 Units into the skin daily.   Marland Kitchen loratadine-pseudoephedrine (CLARITIN-D 12-HOUR) 5-120 MG per tablet Take 1 tablet by mouth as needed.    . mometasone-formoterol (DULERA) 200-5 MCG/ACT AERO Inhale 2 puffs then rinse mouth, twice daily  . Multiple Vitamin (MULTIVITAMIN) tablet Take 1 tablet by mouth daily.  . pravastatin (PRAVACHOL) 20 MG tablet Take 20 mg by mouth daily.  . Tiotropium Bromide Monohydrate (SPIRIVA RESPIMAT)  2.5 MCG/ACT AERS Inhale 2 puffs into the lungs daily.   No current facility-administered medications on file prior to visit.      Allergies  Allergen Reactions  . Chocolate Nausea Only and Other (See Comments)    GI UPSET    Review Of Systems:  Constitutional:   No  weight loss, night sweats,  Fevers, chills, fatigue, or  lassitude.  HEENT:   No headaches,  Difficulty swallowing,  Tooth/dental problems, or  Sore throat,                No  sneezing, itching, ear ache, +nasal congestion, no post nasal drip,   CV:  No chest pain,  Orthopnea, PND, swelling in lower extremities, anasarca, dizziness, palpitations, syncope.   GI  No heartburn, indigestion, abdominal pain, nausea, vomiting, diarrhea, change in bowel habits, loss of appetite, bloody stools.   Resp: + shortness of breath with exertion not at rest.  + excess mucus, + productive cough,  No non-productive cough,  No coughing up of blood.  No change in color of mucus.  + wheezing.  No chest wall deformity  Skin: no rash or lesions.  GU: no dysuria, change in color of urine, no urgency or frequency.  No flank pain, no hematuria   MS:  No joint pain or swelling.  No decreased range of motion.  No back pain.  Psych:  No change in mood or affect. No depression or anxiety.  No memory loss.   Vital Signs BP 120/72 (BP Location: Right Arm, Cuff Size: Normal)   Pulse 66   Temp (!) 100.8 F (38.2 C) (Oral)   Ht 5\' 11"  (1.803 m)   Wt 180 lb (81.6 kg)   SpO2 94%   BMI 25.10 kg/m    Physical Exam:  General- No distress,  A&Ox3, ill appearing ENT: No sinus tenderness, TM clear, pale nasal mucosa, no oral exudate,no post nasal drip, no LAN Cardiac: S1, S2, regular rate and rhythm, no murmur Chest: + wheeze/no  rales/ + dullness right side; no accessory muscle use, no nasal flaring, no sternal retractions Abd.: Soft Non-tender Ext: No clubbing cyanosis, edema Neuro:  normal strength, but tired Skin: No rashes, warm and dry Psych: normal mood and behavior   Assessment/Plan  Asthmatic bronchitis , chronic (HCC) Productive Cough with wheezing and dyspnea with exertion Fever, body aches Plan: CXR Your flu test today was negative. FENO in the office today.( 15) Prednisone taper; 10 mg tablets: 4 tabs x 2 days, 3 tabs x 2 days, 2 tabs x 2 days 1 tab x 2 days then stop. This will affect blood sugars. Please monitor CBG's carefully Use Mucinex 1200 mg once daily with  a full glass of water. ( Secretion mobilization) Continue  your Dulera and Spiriva as you have been doing. Continue your albuterol as needed for shortness of breath or wheezing. Levaquin 500 mg once daily x 7 days Probiotic with Antibiotic Follow up in 2 weeks with Dr. Maple HudsonYoung or me to ensure improvement Follow up sooner if not improving on antibiotics. Please contact office for sooner follow up if symptoms do not improve or worsen or seek emergency care    CAP (community acquired pneumonia) Levaquin 500 mg once daily x 7 days Rest Fluids Mucinex Follow up in 2 weeks with Dr. Maple HudsonYoung or NP Please contact office for sooner follow up if symptoms do not improve or worsen or seek emergency care     Bevelyn NgoSarah F Groce, NP 04/13/2016  6:03 PM

## 2016-04-14 ENCOUNTER — Telehealth: Payer: Self-pay | Admitting: Adult Health

## 2016-04-14 NOTE — Telephone Encounter (Signed)
'  s wife called with questions regarding medications.  Pt saw Kandice RobinsonsSarah Groce in office yesterday, dx with PNA and flare of asthmatic bronchitis.  Wife wondering if prednisone and levquin can be taken at the same time and if prednisone needs to be spread throughout the day or staggered (10mg  pills to make 40mg  dose).  Discussed all medications and CXR with wife.  Told her that all 40mg  of prednisone may be taken at the same time if desired and can be taken with levaquin.  They were very appreciative of the care yesterday and said he is already starting to feel a bit better.  Has f/u appt in office in 2 weeks.

## 2016-04-17 ENCOUNTER — Telehealth: Payer: Self-pay | Admitting: Internal Medicine

## 2016-04-17 NOTE — Telephone Encounter (Signed)
Spoke with pt, who states he had noticed some improvement since his OV with Kandice RobinsonsSarah Groce on 04-13-16. pt states he is having a difficult time catching his breath with laying down.  He is currently on abx and prednisone. Pt has two days left of levaquin and three days of prednisone.  Pt c/o prod cough with clear mucus, post nasal drip & chest tightness. Denies any fever, chills or sweats.  CY please advise. Thanks.   Current Outpatient Prescriptions on File Prior to Visit  Medication Sig Dispense Refill  . albuterol (PROAIR HFA) 108 (90 Base) MCG/ACT inhaler 2 puffs every 4-6 hours as needed (Patient taking differently: Inhale 2 puffs into the lungs every 4 (four) hours as needed for wheezing or shortness of breath. 2 puffs every 4-6 hours as needed) 3 Inhaler 3  . Coenzyme Q10 (CO Q-10) 100 MG CAPS Take 3 capsules by mouth daily.     . diclofenac sodium (VOLTAREN) 1 % GEL apply 2-4 grams three times a day to four times a day to affected area OF FOOT as directed  0  . fluticasone (FLONASE ALLERGY RELIEF) 50 MCG/ACT nasal spray Place 2 sprays into both nostrils daily.    Marland Kitchen. guaiFENesin (MUCINEX) 600 MG 12 hr tablet Take 600 mg by mouth 2 (two) times daily.     . insulin aspart (NOVOLOG) 100 UNIT/ML injection Inject units based on blood sugar readings throughout the day (avg of 20 units a day)    . insulin glargine (LANTUS) 100 UNIT/ML injection Inject 6 Units into the skin daily.     Marland Kitchen. levofloxacin (LEVAQUIN) 500 MG tablet Take 1 tablet (500 mg total) by mouth daily. 7 tablet 0  . loratadine-pseudoephedrine (CLARITIN-D 12-HOUR) 5-120 MG per tablet Take 1 tablet by mouth as needed.      . mometasone-formoterol (DULERA) 200-5 MCG/ACT AERO Inhale 2 puffs then rinse mouth, twice daily 3 Inhaler 3  . Multiple Vitamin (MULTIVITAMIN) tablet Take 1 tablet by mouth daily.    . pravastatin (PRAVACHOL) 20 MG tablet Take 20 mg by mouth daily.    . predniSONE (DELTASONE) 10 MG tablet Take 4 tabs for 2 days, then  3 tabs for 2 days, 2 tabs for 2 days, then 1 tab for 2 days, then stop. 20 tablet 0  . Tiotropium Bromide Monohydrate (SPIRIVA RESPIMAT) 2.5 MCG/ACT AERS Inhale 2 puffs into the lungs daily. 3 Inhaler 3   No current facility-administered medications on file prior to visit.     Allergies  Allergen Reactions  . Chocolate Nausea Only and Other (See Comments)    GI UPSET

## 2016-04-17 NOTE — Telephone Encounter (Signed)
Katie- Can we work him in this week?

## 2016-04-17 NOTE — Telephone Encounter (Signed)
Pt can be seen Thursday at 4:15pm. Thanks.

## 2016-04-17 NOTE — Telephone Encounter (Signed)
Spoke with the pt and scheduled ov with CDY for this Thurs at 4:15 pm

## 2016-04-19 ENCOUNTER — Ambulatory Visit (INDEPENDENT_AMBULATORY_CARE_PROVIDER_SITE_OTHER): Payer: Medicare Other | Admitting: Internal Medicine

## 2016-04-19 ENCOUNTER — Encounter: Payer: Self-pay | Admitting: Internal Medicine

## 2016-04-19 VITALS — BP 118/78 | HR 74 | Ht 71.0 in | Wt 177.0 lb

## 2016-04-19 DIAGNOSIS — J302 Other seasonal allergic rhinitis: Secondary | ICD-10-CM

## 2016-04-19 DIAGNOSIS — J3089 Other allergic rhinitis: Secondary | ICD-10-CM

## 2016-04-19 DIAGNOSIS — J181 Lobar pneumonia, unspecified organism: Secondary | ICD-10-CM

## 2016-04-19 DIAGNOSIS — J45909 Unspecified asthma, uncomplicated: Secondary | ICD-10-CM | POA: Diagnosis not present

## 2016-04-19 DIAGNOSIS — J189 Pneumonia, unspecified organism: Secondary | ICD-10-CM

## 2016-04-19 MED ORDER — DOXYCYCLINE HYCLATE 100 MG PO TABS: 100.0000 mg | ORAL_TABLET | Freq: Two times a day (BID) | ORAL | 0 refills | Status: DC

## 2016-04-19 NOTE — Patient Instructions (Addendum)
Script sent for doxycycline  Stay well hydrated, rested and don't take the weather lurches back and forth for granted.  Order- CXR future 2 weeks at next visit dx community acquired pneumonia  [facility]     Dx Allergic Rhinitis, asthma  Keep appointment as planned

## 2016-04-19 NOTE — Progress Notes (Signed)
Subjective:    Patient ID: Allyne GeeJames Bridgett, male    DOB: Jan 04, 1951, 66 y.o.   MRN: [id number]  HPI M never smoker, followed for asthma and allergic rhinitis, complicated by DM. Speech pathology Swallowing-08/25/2014-high likelihood of LPR Office Spirometry 02/11/2015- WNL- FVC 91%, FEV1 87%, ratio 96%, FEF 25-75% 76% FENO 09/19/15- 116 H  -------------------------------------------------------------------------------------  01/20/2016-66 year old male never smoker followed for asthma, allergic rhinitis, complicated by DM, LPR/dysphagia/GERD FOLLOWS FOR: Pt states his breathing is not bad but has fair amount of congeston(slight chest but mainly nasal). Has not had any Mucinex in past week-trying to not use it as much.  Notices nasal congestion and some chest congestion especially outside [place] Using rescue inhaler once or twice daily. CXR 10/17/2015-NAD  04/19/2016-66 -year-old male never smoker followed for asthma/ bronchitis, allergic rhinitis, complicated by DM, LPR/dysphagia/GERD LOV 1/19- given Levaquin 7 days then  prednisone taper for CAP Better after recent bronchopneumonia. Denies fever/chills and resting better with some residual wheeze and persistent fatigue. Still occasional scant yellow from nose and chest. CXR 04/13/2016 IMPRESSION: Hazy right middle lobe airspace disease which may reflect atelectasis versus pneumonia. Followup PA and lateral chest X-ray is recommended in 3-4 weeks following trial of antibiotic therapy to ensure resolution and exclude underlying malignancy. ACUTE VISIT: Pt states he feels better than he did but continues to have chest congestion with cough. Pt would like to get allergy lab work and discuss going to allergist for ? vaccine injections.  ROS-see HPI   Negative unless "+" Constitutional:    weight loss, night sweats, fevers, chills, + fatigue, lassitude. HEENT:    headaches, difficulty  swallowing, tooth/dental problems, sore throat,       sneezing, itching, ear ache, +nasal congestion, post nasal drip, snoring CV:    chest pain, orthopnea, PND, swelling in lower extremities, anasarca,                                                          dizziness, palpitations Resp:   +shortness of breath with exertion or at rest.                + productive cough,   non-productive cough, coughing up of blood.              + change in color of mucus.  + wheezing.   Skin:    rash or lesions. GI:  No-   heartburn, indigestion, abdominal pain, nausea, vomiting,  GU:  MS:   joint pain, stiffness,  Neuro-     nothing unusual Psych:  change in mood or affect.  depression or anxiety.   memory loss.  OBJ- Physical Exam General- Alert, Oriented, Affect-appropriate, Distress- none acute, Skin- rash-none, lesions- none, excoriation- none Lymphadenopathy- none Head- atraumatic            Eyes- Gross vision intact, PERRLA, conjunctivae and secretions clear            Ears- Hearing aids+            Nose- Clear, no-Septal dev, mucus, polyps, erosion, perforation             Throat- Mallampati II , mucosa clear , drainage- none, tonsils- atrophic Neck- flexible , trachea midline, no stridor , thyroid nl, carotid  no bruit Chest - symmetrical excursion , unlabored           Heart/CV- RRR , no murmur , no gallop  , no rub, nl s1 s2                           - JVD- none , edema- none, stasis changes- none, varices- none           Lung-  wheeze -none, cough- none , dullness-none, rub- none, crackles + few in lower zones           Chest wall-  Abd-  Br/ Gen/ Rectal- Not done, not indicated Extrem- cyanosis- none, clubbing, none, atrophy- none, strength- nl Neuro- grossly intact to observation

## 2016-04-21 NOTE — Assessment & Plan Note (Signed)
He is recovering from pneumonia and anticipating spring pollen season. We discussed options. He would like referral for allergy evaluation since our vaccine program is now closed.

## 2016-04-21 NOTE — Assessment & Plan Note (Signed)
Clinically improving. Especially as a diabetic, full recovery may be slow. Plan-extended antibiotic coverage with doxycycline. Continue fluids and adequate rest.

## 2016-04-26 DIAGNOSIS — N189 Chronic kidney disease, unspecified: Secondary | ICD-10-CM | POA: Diagnosis not present

## 2016-04-26 DIAGNOSIS — N183 Chronic kidney disease, stage 3 (moderate): Secondary | ICD-10-CM | POA: Diagnosis not present

## 2016-04-26 DIAGNOSIS — D631 Anemia in chronic kidney disease: Secondary | ICD-10-CM | POA: Diagnosis not present

## 2016-04-26 DIAGNOSIS — N2581 Secondary hyperparathyroidism of renal origin: Secondary | ICD-10-CM | POA: Diagnosis not present

## 2016-05-03 ENCOUNTER — Ambulatory Visit (INDEPENDENT_AMBULATORY_CARE_PROVIDER_SITE_OTHER)
Admission: RE | Admit: 2016-05-03 | Discharge: 2016-05-03 | Disposition: A | Discharge status: Home / Self Care | Payer: Medicare Other | Source: Ambulatory Visit | Attending: Internal Medicine | Admitting: Internal Medicine

## 2016-05-03 ENCOUNTER — Encounter: Payer: Self-pay | Admitting: Internal Medicine

## 2016-05-03 ENCOUNTER — Ambulatory Visit (INDEPENDENT_AMBULATORY_CARE_PROVIDER_SITE_OTHER): Payer: Medicare Other | Admitting: Internal Medicine

## 2016-05-03 VITALS — BP 120/76 | HR 65 | Ht 71.0 in | Wt 179.8 lb

## 2016-05-03 DIAGNOSIS — J181 Lobar pneumonia, unspecified organism: Secondary | ICD-10-CM | POA: Diagnosis not present

## 2016-05-03 DIAGNOSIS — J209 Acute bronchitis, unspecified: Secondary | ICD-10-CM

## 2016-05-03 DIAGNOSIS — R05 Cough: Secondary | ICD-10-CM | POA: Diagnosis not present

## 2016-05-03 DIAGNOSIS — J189 Pneumonia, unspecified organism: Secondary | ICD-10-CM

## 2016-05-03 MED ORDER — FLUTICASONE-UMECLIDIN-VILANT 100-62.5-25 MCG/INH IN AEPB: 1.0000 | INHALATION_SPRAY | Freq: Every day | RESPIRATORY_TRACT | 0 refills | Status: DC

## 2016-05-03 NOTE — Patient Instructions (Signed)
Order- CXR   Dx asthmatic bronchitis exacerbation  Sample Trelegy Ellipta inhaler     Inhale 1 puff then rinse mouth, once daily    Try this instead of Dulera and Spiriva. Return to them for comparison when the sample runs out.  Please call as needed

## 2016-05-03 NOTE — Progress Notes (Signed)
Subjective:    Patient ID: Allyne GeeJames Kooi, male    DOB: [birth year], 66 y.o.   MRN: [id number]  HPI M never smoker, followed for asthma and allergic rhinitis, complicated by DM. Speech pathology Swallowing-08/25/2014-high likelihood of LPR Office Spirometry 02/11/2015- WNL- FVC 91%, FEV1 87%, ratio 96%, FEF 25-75% 76% FENO 09/19/15- 116 H FENO 04/13/16- 15 -------------------------------------------------------------------------------------  04/19/2016-66 -year-old male never smoker followed for asthma/ bronchitis, allergic rhinitis, complicated by DM, LPR/dysphagia/GERD LOV 1/19- given Levaquin 7 days then  prednisone taper for CAP Better after recent bronchopneumonia. Denies fever/chills and resting better with some residual wheeze and persistent fatigue. Still occasional scant yellow from nose and chest. CXR 04/13/2016 IMPRESSION: Hazy right middle lobe airspace disease which may reflect atelectasis versus pneumonia. Followup PA and lateral chest X-ray is recommended in 3-4 weeks following trial of antibiotic therapy to ensure resolution and exclude underlying malignancy. ACUTE VISIT: Pt states he feels better than he did but continues to have chest congestion with cough. Pt would like to get allergy lab work and discuss going to allergist for ? vaccine injections.  05/04/2811-66 year old male never smoker followed for asthma/bronchitis, allergic rhinitis, complicated by DM, LPR/dysphagia/GERD At last visit we had extended antibiotic coverage originally with Levaquin, then took doxycycline For CXR today FOLLOWS FOR: Pt seen by Maralyn SagoSarah x 3 weeks ago for PNA> doxycycline after levaquin; seemed to be getting better and then about 4 days ago he began coughing again and producing stringy yellow mucus. Pt states that he has been using Mucinex daily.  Continues Dulera 200/by reflux Previous pneumonia had resolved. Now new acute illness 4 days with increased cough "stringy yellow" but no fever, sore  throat. Wife at home with cough. No GI upset. Some breakthrough wheeze only occasionally now.  ROS-see HPI   Negative unless "+" Constitutional:    weight loss, night sweats, fevers, chills, + fatigue, lassitude. HEENT:    headaches, difficulty swallowing, tooth/dental problems, sore throat,       sneezing, itching, ear ache, +nasal congestion, post nasal drip, snoring CV:    chest pain, orthopnea, PND, swelling in lower extremities, anasarca,                                                          dizziness, palpitations Resp:   +shortness of breath with exertion or at rest.                + productive cough,   non-productive cough, coughing up of blood.              + change in color of mucus.  + wheezing.   Skin:    rash or lesions. GI:  No-   heartburn, indigestion, abdominal pain, nausea, vomiting,  GU:  MS:   joint pain, stiffness,  Neuro-     nothing unusual Psych:  change in mood or affect.  depression or anxiety.   memory loss.  OBJ- Physical Exam General- Alert, Oriented, Affect-appropriate, Distress- none acute, Skin- rash-none, lesions- none, excoriation- none Lymphadenopathy- none Head- atraumatic            Eyes- Gross vision intact, PERRLA, conjunctivae and secretions clear            Ears- Hearing aids+            Nose- Clear,  no-Septal dev, mucus, polyps, erosion, perforation             Throat- Mallampati II , mucosa clear , drainage- none, tonsils- atrophic Neck- flexible , trachea midline, no stridor , thyroid nl, carotid no bruit Chest - symmetrical excursion , unlabored           Heart/CV- RRR , no murmur , no gallop  , no rub, nl s1 s2                           - JVD- none , edema- none, stasis changes- none, varices- none           Lung-  wheeze -none, cough- none , dullness-none, rub- none, + coarse breath sounds           Chest wall-  Abd-  Br/ Gen/ Rectal- Not done, not indicated Extrem- cyanosis- none, clubbing, none, atrophy- none, strength- nl Neuro-  grossly intact to observation

## 2016-05-06 NOTE — Assessment & Plan Note (Signed)
Acute exacerbation. Probably viral-don't think antibiotic indicated yet. Discussed supportive care especially fluids. Plan-Try sample Trelegy LABA/LAMA/ICS inhaler sample

## 2016-05-06 NOTE — Assessment & Plan Note (Signed)
Clinically resolved. Clear on most recent CXR

## 2016-05-09 ENCOUNTER — Telehealth: Payer: Self-pay | Admitting: Internal Medicine

## 2016-05-09 MED ORDER — FLUTICASONE-UMECLIDIN-VILANT 100-62.5-25 MCG/INH IN AEPB: 1.0000 | INHALATION_SPRAY | Freq: Every day | RESPIRATORY_TRACT | 3 refills | Status: DC

## 2016-05-09 NOTE — Telephone Encounter (Signed)
Spoke with pt. He is needing a prescription for Trelegy sent to his mail order pharmacy. This has been taken care of. Nothing further was needed.

## 2016-05-14 ENCOUNTER — Ambulatory Visit: Payer: Federal, State, Local not specified - PPO | Admitting: Allergy & Immunology

## 2016-05-24 ENCOUNTER — Encounter: Payer: Self-pay | Admitting: Allergy & Immunology

## 2016-05-24 ENCOUNTER — Ambulatory Visit (INDEPENDENT_AMBULATORY_CARE_PROVIDER_SITE_OTHER): Payer: Medicare Other | Admitting: Allergy & Immunology

## 2016-05-24 VITALS — BP 102/60 | HR 56 | Temp 97.6°F | Resp 16 | Ht 70.5 in | Wt 178.2 lb

## 2016-05-24 DIAGNOSIS — J4550 Severe persistent asthma, uncomplicated: Secondary | ICD-10-CM

## 2016-05-24 DIAGNOSIS — J3089 Other allergic rhinitis: Secondary | ICD-10-CM | POA: Diagnosis not present

## 2016-05-24 MED ORDER — FLUTICASONE PROPIONATE 50 MCG/ACT NA SUSP: 2.0000 | Freq: Every day | NASAL | 5 refills | Status: AC

## 2016-05-24 NOTE — Patient Instructions (Addendum)
1. Severe persistent asthma, uncomplicated - Lung function was normal today. - It seems that the Trilogy is working quite well.  2. Chronic rhinitis - Testing today was positive to: Grass, ragweed, weeds, trees, molds, cat, dog, and cockroach - Avoidance measures discussed. - Stop Claritin and instead start a different second-generation antihistamine such as Zyrtec, Xyzal, or Allegra. - Start Flonase two sprays per nostril once daily. - We can consider allergen immunotherapy if there is no improvement at the next visit.   3. Return in about 2 months (around 07/24/2016).  Please inform us of any Emergency Department visits, hospitalizations, or changes in symptoms. Call us before going to the ED for breathing or allergy symptoms since we might be able to fit you in for a sick visit. Feel free to contact us anytime with any questions, problems, or concerns.  It was a pleasure to meet you today! Best wishes in the [place] Year!   Websites that have reliable patient information: 1. American Academy of Asthma, Allergy, and Immunology: www.aaaai.org 2. Food Allergy Research and Education (FARE): foodallergy.org 3. Mothers of Asthmatics: http://www.asthmacommunitynetwork.org 4. American College of Allergy, Asthma, and Immunology: www.acaai.org  Reducing Pollen Exposure  The American Academy of Allergy, Asthma and Immunology suggests the following steps to reduce your exposure to pollen during allergy seasons.    1. Do not hang sheets or clothing out to dry; pollen may collect on these items. 2. Do not mow lawns or spend time around freshly cut grass; mowing stirs up pollen. 3. Keep windows closed at night.  Keep car windows closed while driving. 4. Minimize morning activities outdoors, a time when pollen counts are usually at their highest. 5. Stay indoors as much as possible when pollen counts or humidity is high and on windy days when pollen tends to remain in the air longer. 6. Use air  conditioning when possible.  Many air conditioners have filters that trap the pollen spores. 7. Use a HEPA room air filter to remove pollen form the indoor air you breathe.  Control of Dog or Cat Allergen  Avoidance is the best way to manage a dog or cat allergy. If you have a dog or cat and are allergic to dog or cats, consider removing the dog or cat from the home. If you have a dog or cat but don't want to find it a new home, or if your family wants a pet even though someone in the household is allergic, here are some strategies that may help keep symptoms at bay:  1. Keep the pet out of your bedroom and restrict it to only a few rooms. Be advised that keeping the dog or cat in only one room will not limit the allergens to that room. 2. Don't pet, hug or kiss the dog or cat; if you do, wash your hands with soap and water. 3. High-efficiency particulate air (HEPA) cleaners run continuously in a bedroom or living room can reduce allergen levels over time. 4. Regular use of a high-efficiency vacuum cleaner or a central vacuum can reduce allergen levels. 5. Giving your dog or cat a bath at least once a week can reduce airborne allergen.  Control of Mold Allergen  Mold and fungi can grow on a variety of surfaces provided certain temperature and moisture conditions exist.  Outdoor molds grow on plants, decaying vegetation and soil.  The major outdoor mold, Alternaria and Cladosporium, are found in very high numbers during hot and dry conditions.  Generally, a late  Summer - Fall peak is seen for common outdoor fungal spores.  Rain will temporarily lower outdoor mold spore count, but counts rise rapidly when the rainy period ends.  The most important indoor molds are Aspergillus and Penicillium.  Dark, humid and poorly ventilated basements are ideal sites for mold growth.  The next most common sites of mold growth are the bathroom and the kitchen.  Outdoor [employer] 1. Use air conditioning and keep  windows closed 2. Avoid exposure to decaying vegetation. 3. Avoid leaf raking. 4. Avoid grain handling. 5. Consider wearing a face mask if working in moldy areas.  Indoor Mold Control 1. Maintain humidity below 50%. 2. Clean washable surfaces with 5% bleach solution. 3. Remove sources e.g. contaminated carpets.  Control of Cockroach Allergen  Cockroach allergen has been identified as an important cause of acute attacks of asthma, especially in urban settings.  There are fifty-five species of cockroach that exist in the [place], however only three, the [place], [place] species produce allergen that can affect patients with Asthma.  Allergens can be obtained from fecal particles, egg casings and secretions from cockroaches.    1. Remove food sources. 2. Reduce access to water. 3. Seal access and entry points. 4. Spray runways with 0.5-1% Diazinon or Chlorpyrifos 5. Blow boric acid power under stoves and refrigerator. 6. Place bait stations (hydramethylnon) at feeding sites.

## 2016-05-24 NOTE — Progress Notes (Addendum)
NEW PATIENT  Date of Service/Encounter:  05/24/16  Referring provider: Jacelyn Pi, MD   Assessment:   Severe persistent asthma, uncomplicated  Chronic allergic rhinitis (grass, ragweed, weeds, trees, molds, cat, dog, cockroach)   Asthma Reportables:  Severity: moderate persistent  Risk: low Control: well controlled   Plan/Recommendations:   1. Severe persistent asthma, uncomplicated - Lung function was normal today. - It seems that the Trelogy is working quite well.  2. Chronic rhinitis (grass, ragweed, weeds, trees, molds, cat, dog, and cockroach) - Testing today was positive to: grass, ragweed, weeds, trees, molds, cat, dog, and cockroach - Avoidance measures discussed. - Stop Claritin and instead start a different second-generation antihistamine such as Zyrtec, Xyzal, or Allegra. - Start Flonase two sprays per nostril once daily. - We can consider allergen immunotherapy if there is no improvement at the next visit.   3. Return in about 2 months (around 07/24/2016).  Subjective:   Robert Baker is a 66 y.o. male presenting today for evaluation of  Chief Complaint  Patient presents with  . Asthma  . Allergic Rhinitis     Robert Baker has a history of the following: Patient Active Problem List   Diagnosis Date Noted  . CAP (community acquired pneumonia) 04/13/2016  . Asthmatic bronchitis , chronic ([facility]) 08/07/2010  . DIABETES MELLITUS 09/08/2007  . ASTHMATIC BRONCHITIS, ACUTE 09/08/2007  . Seasonal and perennial allergic rhinitis 09/08/2007   History obtained from: chart review and patient.  Robert Baker was referred by Jacelyn Pi, MD.     Robert Baker is a 66 y.o. male presenting for an allergy evaluation. He is seeing Robert Baker for his asthma and will continue to follow with him for his asthma management. He has had two incidents from the last year resulting in pneumonia. He feels that allergies might be related. He is on chronic antihistamines (Claritin first  thing in the morning). If he is going outside to do yard work, he will take an extra dose. He has tried using Flonase at one point but he has not needed it with the Trilogy. He does use nasal saline. He feels that outside is a big trigger. He is very active in the outdoors and has been volunteering one month at a time in [facility]. He is a Horticulturist, commercial. He lost his voice due to his symptoms. He does have some sensitivity to cold weather in general but does not have the problems indoors. He was allergy tested 15-20 years ago in Dr. Janee Morn office. He was allergic to dust, trees, and weeds.   He is on Trilogy Ellipta for the last two weeks which seems to be working well. This ins one inhalation once daily. He does not have a history of COPD and has not been a smoker himself, but he has been around smokers. He does have a reaction to chocolate that is more dose dependent with IBS symptoms otherwise he has no other food allergies.   Otherwise, there is no history of other atopic diseases, including drug allergies, food allergies, stinging insect allergies, or urticaria. There is no significant infectious history aside from the two episodes of pneumonia. He does get bronchitis intermittently and will be antibiotics around two times per year, typically two rounds required for full clearance. Vaccinations are up to date.    Past Medical History: Patient Active Problem List   Diagnosis Date Noted  . CAP (community acquired pneumonia) 04/13/2016  . Asthmatic bronchitis , chronic ([facility]) 08/07/2010  . DIABETES MELLITUS 09/08/2007  .  ASTHMATIC BRONCHITIS, ACUTE 09/08/2007  . Seasonal and perennial allergic rhinitis 09/08/2007    Medication List:  Allergies as of 05/24/2016      Reactions   Chocolate Nausea Only, Other (See Comments)   GI UPSET      Medication List       Accurate as of 05/24/16  3:30 PM. Always use your most recent med list.          albuterol 108 (90 Base) MCG/ACT  inhaler Commonly known as:  PROAIR HFA 2 puffs every 4-6 hours as needed   Co Q-10 100 MG Caps Take 3 capsules by mouth daily.   diclofenac sodium 1 % Gel Commonly known as:  VOLTAREN apply 2-4 grams three times a day to four times a day to affected area OF FOOT as directed   FLONASE ALLERGY RELIEF 50 MCG/ACT nasal spray Generic drug:  fluticasone Place 2 sprays into both nostrils daily.   Fluticasone-Umeclidin-Vilant 100-62.5-25 MCG/INH Aepb Commonly known as:  TRELEGY ELLIPTA Inhale 1 puff into the lungs daily.   guaiFENesin 600 MG 12 hr tablet Commonly known as:  MUCINEX Take 600 mg by mouth 2 (two) times daily.   insulin aspart 100 UNIT/ML injection Commonly known as:  novoLOG Inject units based on blood sugar readings throughout the day (avg of 20 units a day)   LANTUS 100 UNIT/ML injection Generic drug:  insulin glargine Inject 6 Units into the skin daily.   loratadine-pseudoephedrine 5-120 MG tablet Commonly known as:  CLARITIN-D 12-hour Take 1 tablet by mouth as needed.   multivitamin tablet Take 1 tablet by mouth daily.   pravastatin 20 MG tablet Commonly known as:  PRAVACHOL Take 20 mg by mouth daily.   Tiotropium Bromide Monohydrate 2.5 MCG/ACT Aers Commonly known as:  SPIRIVA RESPIMAT Inhale 2 puffs into the lungs daily.       Birth History: non-contributory.   Developmental History: Robert Baker has met all milestones on time. He has required no speech therapy, occupational therapy, or physical therapy.   Past Surgical History: Past Surgical History:  Procedure Laterality Date  . KNEE ARTHROSCOPY Bilateral   . SHOULDER ARTHROSCOPY Bilateral   . SHOULDER ARTHROSCOPY WITH SUBACROMIAL DECOMPRESSION, ROTATOR CUFF REPAIR AND BICEP TENDON REPAIR Right 01/14/2014   Procedure: RIGHT SHOULDER ARTHROSCOPY DEBRIDEMENT,DISTAL CLAVICLE EXCISION,ACROMIOPLASTY,ROTATOR CUFF REPAIR,BICEPS TENODESIS;  Surgeon: Ninetta Lights, MD;  Location: [facility];   Service: Orthopedics;  Laterality: Right;  . TONSILLECTOMY  age 83  . WRIST SURGERY Right    navicular fx.     Family History: Family History  Problem Relation Age of Onset  . Breast cancer Mother   . Allergic rhinitis Mother   . Other Father     respiratory failure > pulmonary edema  . Parkinsonism Brother      Social History: Magdaleno lives at home with his wife. He was asked by this 5 years old. They have hardwood area rugs throughout. They have gas heating and central cooling. There are no animals inside the home, but there is wildlife outside the home. He does not use dust mite coverings on his bedding. There is no tobacco exposure. 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    Review of Systems: a 14-point review of systems is pertinent for what is mentioned in HPI.  Otherwise, all other systems were negative. Constitutional: negative other than that listed in the HPI Eyes: negative other than that listed in the HPI Ears, nose, mouth,  throat, and face: negative other than that listed in the HPI Respiratory: negative other than that listed in the HPI Cardiovascular: negative other than that listed in the HPI Gastrointestinal: negative other than that listed in the HPI Genitourinary: negative other than that listed in the HPI Integument: negative other than that listed in the HPI Hematologic: negative other than that listed in the HPI Musculoskeletal: negative other than that listed in the HPI Neurological: negative other than that listed in the HPI Allergy/Immunologic: negative other than that listed in the HPI    Objective:   Blood pressure 102/60, pulse (!) 56, temperature 97.6 F (36.4 C), temperature source Oral, resp. rate 16, height 5' 10.5" (1.791 m), weight 178 lb 3.2 oz (80.8 kg). Body mass index is 25.21 kg/m.   Physical Exam:  General: Alert, interactive, in no acute distress. Cooperative with the exam.  Eyes: No  conjunctival injection present on the right, No conjunctival injection present on the left, PERRL bilaterally, No discharge on the right, No discharge on the left and No Horner-Trantas dots present Ears: Right TM pearly Dehart with normal light reflex, Left TM pearly Wente with normal light reflex, Right TM intact without perforation and Left TM intact without perforation. Hearing aids in place.  Nose/Throat: External nose within normal limits, nasal crease present and septum midline, turbinates edematous and pale with clear discharge, post-pharynx erythematous with cobblestoning in the posterior oropharynx. Tonsils 2+ without exudates Neck: Supple without thyromegaly. Adenopathy: no enlarged lymph nodes appreciated in the anterior cervical, occipital, axillary, epitrochlear, inguinal, or popliteal regions Lungs: Clear to auscultation without wheezing, rhonchi or rales. No increased work of breathing. CV: Normal S1/S2, no murmurs. Capillary refill <2 seconds.  Abdomen: Nondistended, nontender. No guarding or rebound tenderness. Bowel sounds present in all fields and hypoactive  Skin: Warm and dry, without lesions or rashes. Extremities:  No clubbing, cyanosis or edema. Neuro:   Grossly intact. No focal deficits appreciated. Responsive to questions.  Diagnostic studies:  Spirometry: results normal (FEV1: 3.42/97%, FVC: 4.43/100%, FEV1/FVC: 77%).    Spirometry consistent with normal pattern.   Allergy Studies:   Indoor/Outdoor Percutaneous Adult Environmental Panel: negative to the entire panel with adequate controls.   Indoor/Outdoor Selected Intradermal Environmental Panel: positive to Grass mix, ragweed mix, weed mix, tree mix, mold mix #2, mold mix #3, mold mix #4, cat, dog and cockroach. Otherwise negative with adequate controls.      Salvatore Marvel, MD [facility]

## 2016-06-06 DIAGNOSIS — E109 Type 1 diabetes mellitus without complications: Secondary | ICD-10-CM | POA: Diagnosis not present

## 2016-06-06 DIAGNOSIS — E7800 Pure hypercholesterolemia, unspecified: Secondary | ICD-10-CM | POA: Diagnosis not present

## 2016-06-06 DIAGNOSIS — N183 Chronic kidney disease, stage 3 (moderate): Secondary | ICD-10-CM | POA: Diagnosis not present

## 2016-07-09 ENCOUNTER — Ambulatory Visit (INDEPENDENT_AMBULATORY_CARE_PROVIDER_SITE_OTHER): Payer: Medicare Other | Admitting: Podiatry

## 2016-07-09 ENCOUNTER — Encounter: Payer: Self-pay | Admitting: Podiatry

## 2016-07-09 DIAGNOSIS — M2141 Flat foot [pes planus] (acquired), right foot: Secondary | ICD-10-CM

## 2016-07-09 DIAGNOSIS — E109 Type 1 diabetes mellitus without complications: Secondary | ICD-10-CM | POA: Diagnosis not present

## 2016-07-09 DIAGNOSIS — M2042 Other hammer toe(s) (acquired), left foot: Secondary | ICD-10-CM

## 2016-07-09 DIAGNOSIS — M2012 Hallux valgus (acquired), left foot: Secondary | ICD-10-CM

## 2016-07-09 DIAGNOSIS — M722 Plantar fascial fibromatosis: Secondary | ICD-10-CM

## 2016-07-09 DIAGNOSIS — M2011 Hallux valgus (acquired), right foot: Secondary | ICD-10-CM | POA: Diagnosis not present

## 2016-07-09 DIAGNOSIS — M2041 Other hammer toe(s) (acquired), right foot: Secondary | ICD-10-CM | POA: Diagnosis not present

## 2016-07-09 DIAGNOSIS — M2142 Flat foot [pes planus] (acquired), left foot: Secondary | ICD-10-CM

## 2016-07-09 DIAGNOSIS — E1140 Type 2 diabetes mellitus with diabetic neuropathy, unspecified: Secondary | ICD-10-CM

## 2016-07-09 NOTE — Patient Instructions (Addendum)
To check with your insurance about your orthotics the code for the actual insert can be L3020 or L3000 The diagnosis codes are: M72.2 - plantar fasciitis  M21.41- flatfoot  M20.41- hammertoes M20.11- Bunion E10.9- type I diabetes, controlled with long term insulin use

## 2016-07-11 NOTE — Progress Notes (Addendum)
Subjective: 66 year old male presents the office today for diabetic foot evaluation. He has had no history wounds to his foot he denies any claudication symptoms or any numbness or tingling. He is type I diabetic. Is also inquiring about new orthotics. He states he has had toenail fungus for several years and has been using Coconut oil. Denies any systemic complaints such as fevers, chills, nausea, vomiting. No acute changes since last appointment, and no other complaints at this time.   Objective: AAO x3, NAD DP/PT pulses palpable bilaterally, CRT less than 3 seconds Decreased medial arch height upon weightbearing. There is no tenderness palpation bilateral. There is no pain along the course or insertion of plantar fasciitis with pain along the course of plantar fascia after prolonged activities intermittently. There is no overlying edema, erythema, increase in warmth. Hammertoe is present was as well as mild HAV. Nails are dystrophic, discolored there is no pain of the toenail there is no surrounding redness or drainage. No open lesions or pre-ulcerative lesions.  No pain with calf compression, swelling, warmth, erythema  Assessment: Diabetic foot evaluation, onychomycosis, flatfeet/fasciitis  Plan: -All treatment options discussed with the patient including all alternatives, risks, complications.  -Discussed custom molded inserts. These were checked with his insurance company in regards to coverage for this. We will also check on this for him. I gave him the diagnosis codes. -Discussed other options for onychomycosis. He was started off on any medications. Discussed manner soaks as well as Tea tree oil. -Patient would like to proceed with custom molded inserts. He was measured today and sent to [facility] -Daily foot inspection. -Patient encouraged to call the office with any questions, concerns, change in symptoms.   Ovid Curd, DPM

## 2016-07-25 ENCOUNTER — Encounter: Payer: Self-pay | Admitting: Allergy & Immunology

## 2016-07-25 ENCOUNTER — Ambulatory Visit (INDEPENDENT_AMBULATORY_CARE_PROVIDER_SITE_OTHER): Payer: Medicare Other | Admitting: Allergy & Immunology

## 2016-07-25 VITALS — BP 110/70 | HR 80 | Temp 98.0°F | Resp 16

## 2016-07-25 DIAGNOSIS — J4550 Severe persistent asthma, uncomplicated: Secondary | ICD-10-CM | POA: Diagnosis not present

## 2016-07-25 DIAGNOSIS — J3089 Other allergic rhinitis: Secondary | ICD-10-CM

## 2016-07-25 MED ORDER — AZELASTINE HCL 0.1 % NA SOLN: 2.0000 | Freq: Two times a day (BID) | NASAL | 5 refills | Status: DC

## 2016-07-25 MED ORDER — CLARITHROMYCIN 500 MG PO TABS: 500.0000 mg | ORAL_TABLET | Freq: Two times a day (BID) | ORAL | 0 refills | Status: AC

## 2016-07-25 NOTE — Progress Notes (Signed)
FOLLOW UP  Date of Service/Encounter:  07/25/16   Assessment:   Severe persistent asthma, uncomplicated  Chronic nonseasonal allergic rhinitis (grass, ragweed, weeds, trees, molds, cat, dog, cockroach)   Asthma Reportables:  Severity: severe persistent  Risk: low Control: well controlled   Plan/Recommendations:   1. Severe persistent asthma, uncomplicated - Symptoms stable on Trelegy one inhalation once daily. - Continue to follow up with Dr. Maple Hudson.   2. Chronic rhinitis (grass, ragweed, weeds, trees, molds, cat, dog, and cockroach) - Continue with Claritin  in the morning and Allegra  in the evening.  - Continue with Flonase two sprays per nostril once daily. - Add Astelin two sprays per nostril up to twice daily.  - Prescription for Biaxin  twice daily for 14 days provided.  - We can consider allergen immunotherapy if there is no improvement at the next visit, although this would be difficult with your traveling.   - We did provide information on allergy injections including information needed to discuss copayments with his insurance company. - He is dual covered by both Medicare and BCBS, therefore copayments should be minimal.   3. Return in about 6 months (around 01/25/2017).   Subjective:   Lenell Lama is a 66 y.o. male presenting today for follow up of  Chief Complaint  Patient presents with  . Nasal Congestion  . Headache    Stpehen Petitjean has a history of the following: Patient Active Problem List   Diagnosis Date Noted  . CAP (community acquired pneumonia) 04/13/2016  . Asthmatic bronchitis , chronic ([facility]) 08/07/2010  . DIABETES MELLITUS 09/08/2007  . ASTHMATIC BRONCHITIS, ACUTE 09/08/2007  . Seasonal and perennial allergic rhinitis 09/08/2007    History obtained from: chart review and patient.  Lamere Lightner was referred by Dorisann Frames, MD.     Alika is a 66 y.o. male presenting for a follow up visit. He was last seen as a new patient  in March 2018. At that time, he had skin testing was positive to grass, ragweed, weeds, trees, molds, cat, dog, and cockroach. We started and antihistamine in addition to Flonase 2 sprays per nostril daily. We did discuss allergen immunotherapy if there is no improvement in his symptoms. He does have a history of severe persistent asthma, which is managed by Dr. Maple Hudson. He was well controlled on Trelegy one inhalation once daily.   Since the last visit, he has done well. He is on Flonase two sprays per nostril daily. He is on Claritin in the morning and Allegra at bedtime. He continues to have clear discharge with postnasal drip. He has not been on nasal antihistamines. He does endorse sinus pressure, more so recently when the pollens kicked up. 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 He is currently retired but it seems that he keeps fairly busy. He is planning to go to [facility] for 5 weeks in June. He typically requires 0-1 courses of antibiotics per year for sinusitis area the last time that he needed antibiotics was when he went to [facility], at which time he was treated with azithromycin. He does have history of insulin-dependent type 2 diabetes, which is well controlled.   Otherwise, there have been no changes to his past medical history, surgical history, family history, or social history.    Review of Systems: a 14-point review of systems is pertinent for what is mentioned in HPI.  Otherwise, all other systems were  negative. Constitutional: negative other than that listed in the HPI Eyes: negative other than that listed in the HPI Ears, nose, mouth, throat, and face: negative other than that listed in the HPI Respiratory: negative other than that listed in the HPI Cardiovascular: negative other than that listed in the HPI Gastrointestinal: negative other than that listed in the HPI Genitourinary:  negative other than that listed in the HPI Integument: negative other than that listed in the HPI Hematologic: negative other than that listed in the HPI Musculoskeletal: negative other than that listed in the HPI Neurological: negative other than that listed in the HPI Allergy/Immunologic: negative other than that listed in the HPI    Objective:   Blood pressure 110/70, pulse 80, temperature 98 F (36.7 C), temperature source Oral, resp. rate 16, SpO2 98 %. There is no height or weight on file to calculate BMI.   Physical Exam:  General: Alert, interactive, in no acute distress. Pleasant male. Eyes: No conjunctival injection present on the right, No conjunctival injection present on the left, PERRL bilaterally, No discharge on the right, No discharge on the left and No Horner-Trantas dots present Ears: Hearing aids in place bilaterally, Right TM pearly Grape with normal light reflex, Left TM pearly Roderick with normal light reflex, Right TM intact without perforation and Left TM intact without perforation.  Nose/Throat: External nose within normal limits and septum midline, turbinates minimally edematous without discharge, post-pharynx mildly erythematous without cobblestoning in the posterior oropharynx. Tonsils 2+ without exudates Neck: Supple without thyromegaly. Lungs: Clear to auscultation without wheezing, rhonchi or rales. No increased work of breathing. CV: Normal S1/S2, no murmurs. Capillary refill <2 seconds.  Skin: Warm and dry, without lesions or rashes. Neuro:   Grossly intact. No focal deficits appreciated. Responsive to questions.   Diagnostic studies: none    Malachi Bonds, MD [facility]

## 2016-07-25 NOTE — Patient Instructions (Addendum)
1. Severe persistent asthma, uncomplicated - Lung function was normal today. - Symptoms stable on Trelegy one inhalation once daily. - Continue to follow up with Dr. Maple Hudson.   2. Chronic rhinitis (grass, ragweed, weeds, trees, molds, cat, dog, and cockroach) - Continue with Claritin  in the morning and Allegra  in the evening.  - Continue with Flonase two sprays per nostril once daily. - Add Astelin two sprays per nostril up to twice daily.  - Prescription for Biaxin  twice daily for 14 days provided.  - We can consider allergen immunotherapy if there is no improvement at the next visit, although this would be difficult with your traveling.    3. Return in about 6 months (around 01/25/2017).  Please inform us of any Emergency Department visits, hospitalizations, or changes in symptoms. Call us before going to the ED for breathing or allergy symptoms since we might be able to fit you in for a sick visit. Feel free to contact us anytime with any questions, problems, or concerns.  It was a pleasure to meet you today! Best wishes in the [place] Year!   Websites that have reliable patient information: 1. American Academy of Asthma, Allergy, and Immunology: www.aaaai.org 2. Food Allergy Research and Education (FARE): foodallergy.org 3. Mothers of Asthmatics: http://www.asthmacommunitynetwork.org 4. American College of Allergy, Asthma, and Immunology: www.acaai.org

## 2016-08-10 ENCOUNTER — Telehealth: Payer: Self-pay | Admitting: Allergy & Immunology

## 2016-08-10 MED ORDER — AZELASTINE HCL 0.1 % NA SOLN: 2.0000 | Freq: Two times a day (BID) | NASAL | 1 refills | Status: DC

## 2016-08-10 NOTE — Telephone Encounter (Signed)
Pt called and needs to have Azelastine  90 day supply [facility] [phone].

## 2016-08-10 NOTE — Telephone Encounter (Signed)
Called patient and informed him that we sent in the 90 day script to [facility].

## 2016-08-31 ENCOUNTER — Telehealth: Payer: Self-pay | Admitting: Allergy & Immunology

## 2016-08-31 MED ORDER — CLARITHROMYCIN 500 MG PO TABS: 500.0000 mg | ORAL_TABLET | Freq: Two times a day (BID) | ORAL | 0 refills | Status: AC

## 2016-08-31 NOTE — Telephone Encounter (Signed)
Called to inquire why Robert Baker needed this RX .Marland Kitchen.Marland Kitchen. He states that when he saw Dr. Dellis AnesGallagher on May 2nd, he prescribed Biaxin which Robert Baker chose to hold off on filling. He wanted to wait and see if he could get through his symptoms without an antibiotic. Unfortunately, he is not any better. He would like to know if the rx can be resent. Please advise.

## 2016-08-31 NOTE — Telephone Encounter (Signed)
Pt called and needs to have the Clarithromycin called into [facility] on [address] [facility]. [phone]

## 2016-08-31 NOTE — Telephone Encounter (Signed)
I sent in the script to the pharmacy [facility].  Malachi BondsJoel Bryon Parker, MD FAAAAI [facility]

## 2016-09-04 ENCOUNTER — Encounter: Payer: Self-pay | Admitting: Internal Medicine

## 2016-09-04 ENCOUNTER — Ambulatory Visit (INDEPENDENT_AMBULATORY_CARE_PROVIDER_SITE_OTHER): Payer: Medicare Other | Admitting: Internal Medicine

## 2016-09-04 DIAGNOSIS — J302 Other seasonal allergic rhinitis: Secondary | ICD-10-CM

## 2016-09-04 DIAGNOSIS — J449 Chronic obstructive pulmonary disease, unspecified: Secondary | ICD-10-CM | POA: Diagnosis not present

## 2016-09-04 DIAGNOSIS — J3089 Other allergic rhinitis: Secondary | ICD-10-CM | POA: Diagnosis not present

## 2016-09-04 NOTE — Assessment & Plan Note (Signed)
He is now working with allergist Dr. Dellis AnesGallagher

## 2016-09-04 NOTE — Assessment & Plan Note (Signed)
He expects to be started with an insulin pump when he returns from his summer trip

## 2016-09-04 NOTE — Progress Notes (Signed)
Subjective:    Patient ID: Robert Baker, male    DOB: 10-May-1950, 66 y.o.   MRN: [id number]  HPI M never smoker, followed for asthma and allergic rhinitis, complicated by DM. Speech pathology Swallowing-08/25/2014-high likelihood of LPR Office Spirometry 02/11/2015- WNL- FVC 91%, FEV1 87%, ratio 96%, FEF 25-75% 76% FENO 09/19/15- 116 H FENO 04/13/16- 15 -------------------------------------------------------------------------------------   05/03/16-66 year old male never smoker followed for asthma/bronchitis, allergic rhinitis, complicated by DM, LPR/dysphagia/GERD At last visit we had extended antibiotic coverage originally with Levaquin, then took doxycycline For CXR today FOLLOWS FOR: Pt seen by Maralyn SagoSarah x 3 weeks ago for PNA> doxycycline after levaquin; seemed to be getting better and then about 4 days ago he began coughing again and producing stringy yellow mucus. Pt states that he has been using Mucinex daily.  Continues Dulera 200/by reflux Previous pneumonia had resolved. Now new acute illness 4 days with increased cough "stringy yellow" but no fever, sore throat. Wife at home with cough. No GI upset. Some breakthrough wheeze only occasionally now.  09/04/16- 66 year old male never smoker followed for asthma/bronchitis, allergic rhinitis, complicated by DM, LPR/dysphagia/GERD 4 month follow up for chronic chest congestion. Denies any increased SOB or coughing. States he feels well.   His allergist has started him on Astelin nasal spray which he is trying. Trelegy works very well. Still uses albuterol rescue inhaler once or twice daily. He is headed out [place]. Last year he came back from this with pneumonia. CXR 05/03/16 Hyperinflation consistent with reactive airway disease. There is no residual pneumonia. There is no acute cardiopulmonary abnormality.   ROS-see HPI   Negative unless "+" Constitutional:    weight loss, night sweats, fevers, chills, +  fatigue, lassitude. HEENT:    headaches, difficulty swallowing, tooth/dental problems, sore throat,       sneezing, itching, ear ache, +nasal congestion, post nasal drip, snoring CV:    chest pain, orthopnea, PND, swelling in lower extremities, anasarca,                                                          dizziness, palpitations Resp:   +shortness of breath with exertion or at rest.                 productive cough,   non-productive cough, coughing up of blood.               change in color of mucus.   wheezing.   Skin:    rash or lesions. GI:  No-   heartburn, indigestion, abdominal pain, nausea, vomiting,  GU:  MS:   joint pain, stiffness,  Neuro-     nothing unusual Psych:  change in mood or affect.  depression or anxiety.   memory loss.  OBJ- Physical Exam General- Alert, Oriented, Affect-appropriate, Distress- none acute, Skin- rash-none, lesions- none, excoriation- none Lymphadenopathy- none Head- atraumatic            Eyes- Gross vision intact, PERRLA, conjunctivae and secretions clear            Ears- Hearing aids+            Nose- Clear, no-Septal dev, mucus, polyps, erosion, perforation             Throat- Mallampati II ,  mucosa clear , drainage- none, tonsils- atrophic Neck- flexible , trachea midline, no stridor , thyroid nl, carotid no bruit Chest - symmetrical excursion , unlabored           Heart/CV- RRR , no murmur , no gallop  , no rub, nl s1 s2                           - JVD- none , edema- none, stasis changes- none, varices- none           Lung-  wheeze -none, cough- none , dullness-none, rub- none, clear           Chest wall-  Abd-  Br/ Gen/ Rectal- Not done, not indicated Extrem- cyanosis- none, clubbing, none, atrophy- none, strength- nl Neuro- grossly intact to observation

## 2016-09-04 NOTE — Patient Instructions (Signed)
We can continue present meds- please call as needed 

## 2016-09-04 NOTE — Assessment & Plan Note (Signed)
Currently under good control on maintenance Trelegy but still needing rescue inhaler once or twice a day. He is encouraged to avoid exposure to campfire smoke and similar irritant triggers.

## 2016-09-06 ENCOUNTER — Encounter: Payer: Self-pay | Admitting: Podiatry

## 2016-09-06 ENCOUNTER — Ambulatory Visit (INDEPENDENT_AMBULATORY_CARE_PROVIDER_SITE_OTHER): Payer: Medicare Other | Admitting: Podiatry

## 2016-09-06 DIAGNOSIS — L03031 Cellulitis of right toe: Secondary | ICD-10-CM | POA: Diagnosis not present

## 2016-09-06 NOTE — Progress Notes (Signed)
Subjective:    Patient ID: Robert Baker, male   DOB: 66 y.o.   MRN: [id number]   HPI patient points to the right big toenail stating that it's been very sore on the inside corner and it is getting ready to go on a trip for 5 weeks and cannot afford to have an infected ingrown toenail    ROS      Objective:  Physical Exam neurovascular status intact negative Homan sign was noted with patient's right hallux lateral border found to be incurvated with distal redness and drainage localized with no proximal edema erythema drainage     Assessment:    Paronychia right hallux     Plan:    H&P condition reviewed and recommended removal of the corner allowing drainage to occur with the possibility for a permanent procedure later in the year. I explained procedure and risk and today infiltrated 60 Milligan times like Marcaine mixture removed the lateral border all proud flesh allowed channel for drainage and applied sterile dressing. Reappoint to recheck again after he returns from trip

## 2016-09-06 NOTE — Patient Instructions (Signed)

## 2016-09-25 DIAGNOSIS — R05 Cough: Secondary | ICD-10-CM | POA: Diagnosis not present

## 2016-09-25 DIAGNOSIS — J309 Allergic rhinitis, unspecified: Secondary | ICD-10-CM | POA: Diagnosis not present

## 2016-09-25 DIAGNOSIS — Z760 Encounter for issue of repeat prescription: Secondary | ICD-10-CM | POA: Diagnosis not present

## 2016-09-25 DIAGNOSIS — R350 Frequency of micturition: Secondary | ICD-10-CM | POA: Diagnosis not present

## 2016-09-25 DIAGNOSIS — E109 Type 1 diabetes mellitus without complications: Secondary | ICD-10-CM | POA: Diagnosis not present

## 2016-09-25 DIAGNOSIS — Z8709 Personal history of other diseases of the respiratory system: Secondary | ICD-10-CM | POA: Diagnosis not present

## 2016-11-13 DIAGNOSIS — N183 Chronic kidney disease, stage 3 (moderate): Secondary | ICD-10-CM | POA: Diagnosis not present

## 2016-11-13 DIAGNOSIS — E7800 Pure hypercholesterolemia, unspecified: Secondary | ICD-10-CM | POA: Diagnosis not present

## 2016-11-13 DIAGNOSIS — E109 Type 1 diabetes mellitus without complications: Secondary | ICD-10-CM | POA: Diagnosis not present

## 2016-11-16 DIAGNOSIS — Z23 Encounter for immunization: Secondary | ICD-10-CM | POA: Diagnosis not present

## 2016-11-16 DIAGNOSIS — Z0000 Encounter for general adult medical examination without abnormal findings: Secondary | ICD-10-CM | POA: Diagnosis not present

## 2016-12-05 DIAGNOSIS — E109 Type 1 diabetes mellitus without complications: Secondary | ICD-10-CM | POA: Diagnosis not present

## 2016-12-06 DIAGNOSIS — H25043 Posterior subcapsular polar age-related cataract, bilateral: Secondary | ICD-10-CM | POA: Diagnosis not present

## 2016-12-06 DIAGNOSIS — H2512 Age-related nuclear cataract, left eye: Secondary | ICD-10-CM | POA: Diagnosis not present

## 2016-12-06 DIAGNOSIS — E113393 Type 2 diabetes mellitus with moderate nonproliferative diabetic retinopathy without macular edema, bilateral: Secondary | ICD-10-CM | POA: Diagnosis not present

## 2016-12-06 DIAGNOSIS — H2513 Age-related nuclear cataract, bilateral: Secondary | ICD-10-CM | POA: Diagnosis not present

## 2016-12-06 DIAGNOSIS — H25013 Cortical age-related cataract, bilateral: Secondary | ICD-10-CM | POA: Diagnosis not present

## 2017-01-07 ENCOUNTER — Telehealth: Payer: Self-pay | Admitting: Internal Medicine

## 2017-01-07 MED ORDER — ALBUTEROL SULFATE HFA 108 (90 BASE) MCG/ACT IN AERS
INHALATION_SPRAY | RESPIRATORY_TRACT | 3 refills | Status: DC
Start: 2017-01-07 — End: 2018-07-22

## 2017-01-07 NOTE — Telephone Encounter (Signed)
Spoke with patient. Advised him that the RX has been sent to [facility]. He verbalized understanding. Nothing else needed at time of call.

## 2017-01-15 DIAGNOSIS — H25812 Combined forms of age-related cataract, left eye: Secondary | ICD-10-CM | POA: Diagnosis not present

## 2017-01-15 DIAGNOSIS — H2512 Age-related nuclear cataract, left eye: Secondary | ICD-10-CM | POA: Diagnosis not present

## 2017-02-07 DIAGNOSIS — H25011 Cortical age-related cataract, right eye: Secondary | ICD-10-CM | POA: Diagnosis not present

## 2017-02-07 DIAGNOSIS — H2511 Age-related nuclear cataract, right eye: Secondary | ICD-10-CM | POA: Diagnosis not present

## 2017-02-07 DIAGNOSIS — H25041 Posterior subcapsular polar age-related cataract, right eye: Secondary | ICD-10-CM | POA: Diagnosis not present

## 2017-02-19 DIAGNOSIS — H2511 Age-related nuclear cataract, right eye: Secondary | ICD-10-CM | POA: Diagnosis not present

## 2017-02-19 DIAGNOSIS — H25811 Combined forms of age-related cataract, right eye: Secondary | ICD-10-CM | POA: Diagnosis not present

## 2017-03-06 ENCOUNTER — Encounter: Payer: Self-pay | Admitting: Internal Medicine

## 2017-03-06 ENCOUNTER — Ambulatory Visit (INDEPENDENT_AMBULATORY_CARE_PROVIDER_SITE_OTHER): Payer: Medicare Other | Admitting: Internal Medicine

## 2017-03-06 DIAGNOSIS — J449 Chronic obstructive pulmonary disease, unspecified: Secondary | ICD-10-CM

## 2017-03-06 NOTE — Progress Notes (Signed)
Subjective:    Patient ID: Robert Baker, male    DOB: [birth year], 66 y.o.   MRN: [id number]  HPI M never smoker, followed for asthma and allergic rhinitis, complicated by DM. Speech pathology Swallowing-08/25/2014-high likelihood of LPR Office Spirometry 02/11/2015- WNL- FVC 91%, FEV1 87%, ratio 96%, FEF 25-75% 76% FENO 09/19/15- 116 H FENO 04/13/16- 15 -------------------------------------------------------------------------------------  09/04/16- 66 year old male never smoker followed for asthma/bronchitis, allergic rhinitis, complicated by DM, LPR/dysphagia/GERD 4 month follow up for chronic chest congestion. Denies any increased SOB or coughing. States he feels well.   His allergist has started him on Astelin nasal spray which he is trying. Trelegy works very well. Still uses albuterol rescue inhaler once or twice daily. He is headed out [place] next [place]. Last year he came back from this with pneumonia. CXR 05/03/16 Hyperinflation consistent with reactive airway disease. There is no residual pneumonia. There is no acute cardiopulmonary abnormality.  03/06/17- 66 year old male never smoker followed for asthma/bronchitis, allergic rhinitis, complicated by DM, LPR/dysphagia/GERD Asthma; Pt states Tregely is working for him but has concern with warning on label of not taking for asthma. Woud like to discuss.  We discussed asthma/bronchitis and indications for the classes of bronchodilators and ICS.  He does not have specific contraindications for LAMA.  He tried off of Trelegy for 3 days and began to note wheeze and chest tightness which resolved promptly as he resumed it. He is aware of LPR/retention in his throat during swallowing. He knows how to clear this. Not much heartburn. Discussed previous swallow eval.  Gets pneumonia vaccine from PCP Dr. Vianne Bulls Alan Ross.  [facility] flu [facility]  ROS-see HPI   + = positive Constitutional:    weight loss, night sweats, fevers,  chills, + fatigue, lassitude. HEENT:    headaches, difficulty swallowing, tooth/dental problems, sore throat,       sneezing, itching, ear ache, +nasal congestion, post nasal drip, snoring CV:    chest pain, orthopnea, PND, swelling in lower extremities, anasarca,                                                          dizziness, palpitations Resp:   shortness of breath with exertion or at rest.                 productive cough,   non-productive cough, coughing up of blood.               change in color of mucus.   wheezing.   Skin:    rash or lesions. GI:  No-   heartburn, indigestion, abdominal pain, nausea, vomiting,  GU:  MS:   joint pain, stiffness,  Neuro-     nothing unusual Psych:  change in mood or affect.  depression or anxiety.   memory loss.  OBJ- Physical Exam General- Alert, Oriented, Affect-appropriate, Distress- none acute, Skin- rash-none, lesions- none, excoriation- none Lymphadenopathy- none Head- atraumatic            Eyes- Gross vision intact, PERRLA, conjunctivae and secretions clear            Ears- Hearing aids+            Nose- Clear, no-Septal dev, mucus, polyps, erosion, perforation  Throat- Mallampati II , mucosa clear , drainage- none, tonsils- atrophic Neck- flexible , trachea midline, no stridor , thyroid nl, carotid no bruit Chest - symmetrical excursion , unlabored           Heart/CV- RRR , no murmur , no gallop  , no rub, nl s1 s2                           - JVD- none , edema- none, stasis changes- none, varices- none           Lung-  wheeze -none, cough- none , dullness-none, rub- none, clear           Chest wall-  Abd-  Br/ Gen/ Rectal- Not done, not indicated Extrem- cyanosis- none, clubbing, none, atrophy- none, strength- nl Neuro- grossly intact to observation

## 2017-03-06 NOTE — Assessment & Plan Note (Signed)
We discussed his physiology and trigger patterns.  I think Trelegy is appropriate for him and he has demonstrated it works very well.  He still needs his rescue inhaler before going out in cold, before exertion and occasionally for exacerbation.  He is not having sleep disturbance.

## 2017-03-06 NOTE — Patient Instructions (Signed)
We can continue what we are doing. Please call if we can help.

## 2017-03-26 IMAGING — DX DG CHEST 2V
2 series · 2 of 2 positions shown · non-contrast
Comparison: 09/19/2015

CLINICAL DATA: Bronchitis.  Follow-up abnormal chest x-ray.

EXAM:
CHEST  2 VIEW

[chest pa]
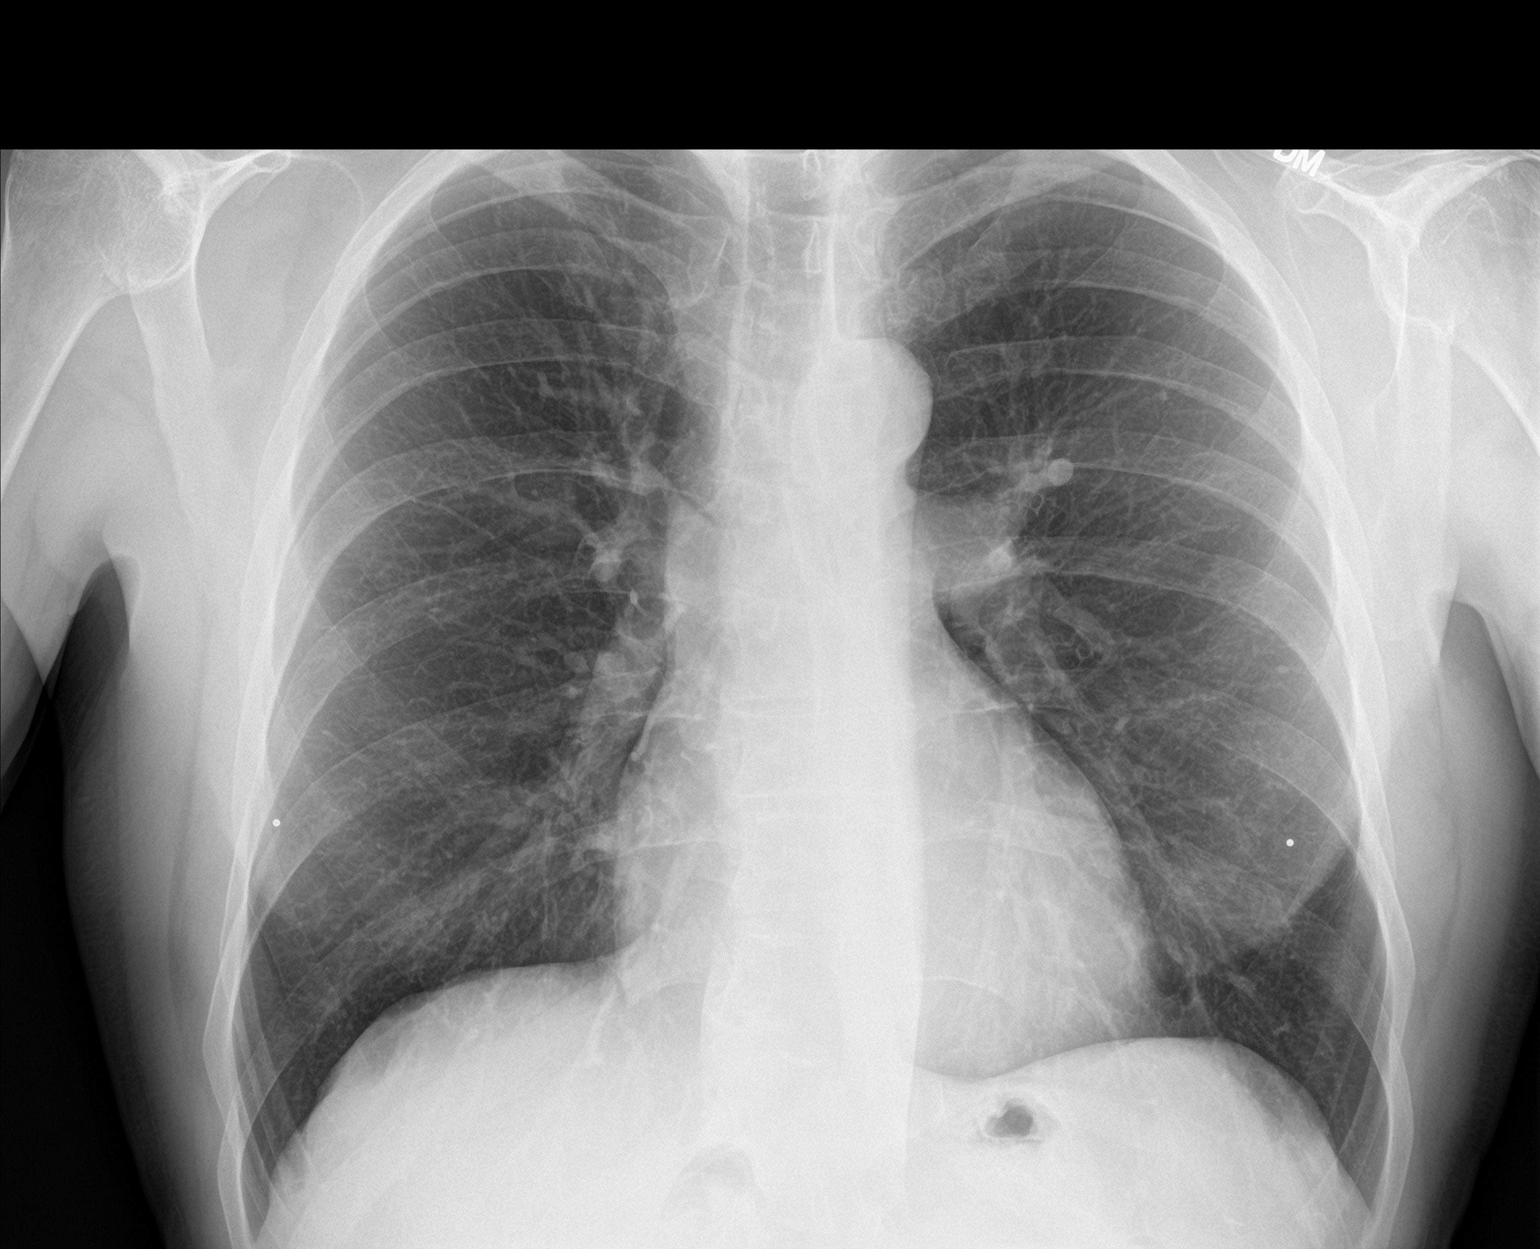

[chest lat]
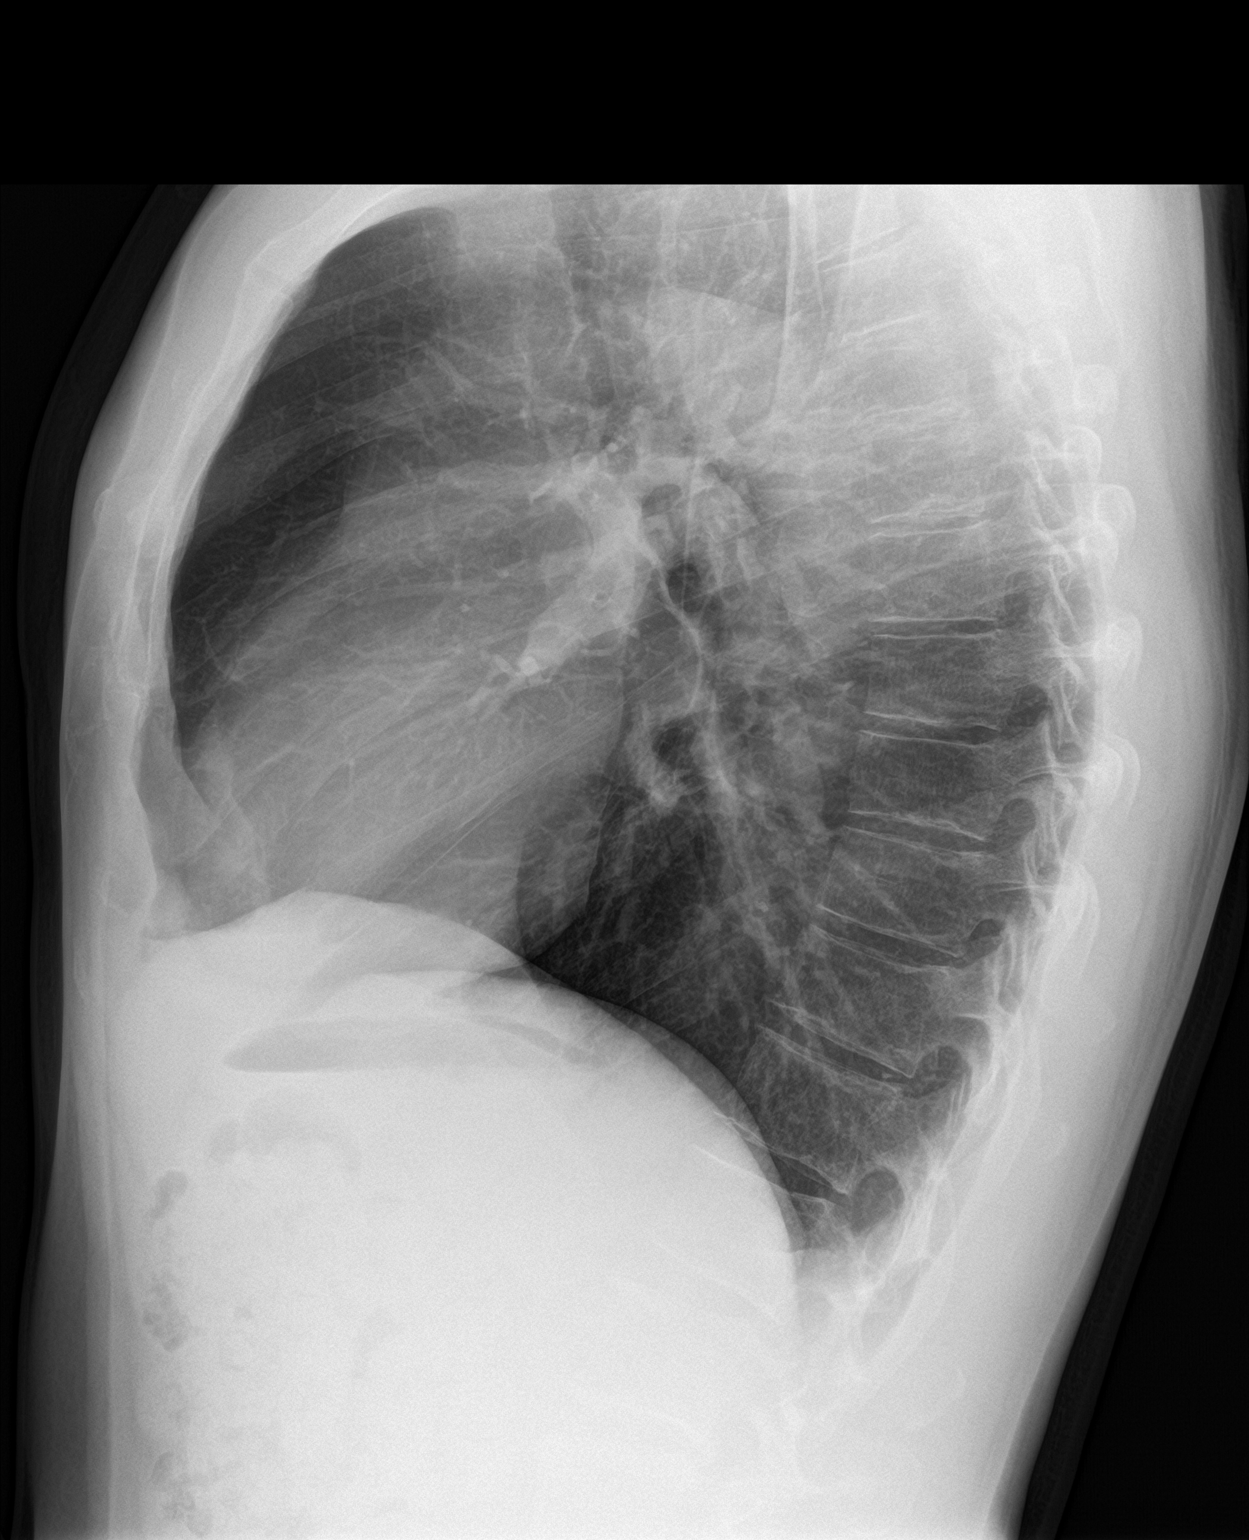

[2 of 2 positions shown; findings below may reference images not displayed]

FINDINGS: No confluent airspace opacities on today's study. Heart is normal
size. No effusions. No acute bony abnormality. Previously seen
density in the anterior chest on the lateral view as resolved.
IMPRESSION: No active disease.

## 2017-04-03 DIAGNOSIS — E109 Type 1 diabetes mellitus without complications: Secondary | ICD-10-CM | POA: Diagnosis not present

## 2017-04-03 DIAGNOSIS — N183 Chronic kidney disease, stage 3 (moderate): Secondary | ICD-10-CM | POA: Diagnosis not present

## 2017-04-03 DIAGNOSIS — E7800 Pure hypercholesterolemia, unspecified: Secondary | ICD-10-CM | POA: Diagnosis not present

## 2017-05-09 ENCOUNTER — Other Ambulatory Visit: Payer: Self-pay | Admitting: Allergy & Immunology

## 2017-05-27 ENCOUNTER — Other Ambulatory Visit: Payer: Self-pay | Admitting: Internal Medicine

## 2017-05-27 MED ORDER — FLUTICASONE-UMECLIDIN-VILANT 100-62.5-25 MCG/INH IN AEPB: 1.0000 | INHALATION_SPRAY | Freq: Every day | RESPIRATORY_TRACT | 3 refills | Status: DC

## 2017-05-27 NOTE — Telephone Encounter (Signed)
Pt is current with OV's and medication is on med list. I have sent Rx request through Epic to pharmacy. Nothing more needed at this time.

## 2017-05-29 ENCOUNTER — Encounter: Payer: Self-pay | Admitting: Allergy & Immunology

## 2017-05-29 ENCOUNTER — Ambulatory Visit (INDEPENDENT_AMBULATORY_CARE_PROVIDER_SITE_OTHER): Payer: Medicare Other | Admitting: Allergy & Immunology

## 2017-05-29 VITALS — BP 128/78 | HR 55 | Resp 16 | Ht 71.0 in | Wt 179.6 lb

## 2017-05-29 DIAGNOSIS — J4550 Severe persistent asthma, uncomplicated: Secondary | ICD-10-CM

## 2017-05-29 MED ORDER — AZELASTINE HCL 0.1 % NA SOLN: 2.0000 | Freq: Two times a day (BID) | NASAL | 1 refills | Status: DC

## 2017-05-29 NOTE — Progress Notes (Signed)
FOLLOW UP  Date of Service/Encounter:  05/29/17   Assessment:   Severe persistent asthma, uncomplicated  Chronic nonseasonal allergic rhinitis (grass, ragweed, weeds, trees, molds, cat, dog, cockroach)   Asthma Reportables:  Severity: severe persistent  Risk: low Control: well controlled   Plan/Recommendations:   1. Severe persistent asthma, uncomplicated - Lung function was normal today. - Symptoms stable on Trelegy one inhalation once daily. - Continue to follow up with Dr. Maple Hudson.   2. Chronic rhinitis (grass, ragweed, weeds, trees, molds, cat, dog, and cockroach) - Continue with Claritin 10mg  in the morning and Allegra 180mg  in the evening.  - Continue with Flonase two sprays per nostril once daily. - Continue with Astelin two sprays per nostril up to twice daily.  - We can consider allergen immunotherapy if there is no improvement at the next visit, although this would be difficult with your traveling.   3. Return in about 1 year (around 05/30/2018).  Subjective:   Anakin Varkey is a 67 y.o. male presenting today for follow up of  Chief Complaint  Patient presents with  . Asthma    Arsenio Schnorr has a history of the following: Patient Active Problem List   Diagnosis Date Noted  . CAP (community acquired pneumonia) 04/13/2016  . Asthmatic bronchitis , chronic (HCC) 08/07/2010  . DIABETES MELLITUS 09/08/2007  . ASTHMATIC BRONCHITIS, ACUTE 09/08/2007  . Seasonal and perennial allergic rhinitis 09/08/2007    History obtained from: chart review and patient.  Daphene Jaeger Primary Care Provider is Dorisann Frames, MD.     Erven is a 67 y.o. male presenting for a follow up visit. He was last seen in May 2018. At that time, asthma was well controlled with Trelegy one puff once daily. He continued to be followed by Dr. Maple Hudson in Pulmonology. For his chronic P/SAR, we continued him on Claritin 10mg  daily and Allegra 180mg  in the evening. We also continued Flonase two  sprays per nostril daily in conjunction with Astelin two sprays per nostril up to BID. We discussed allergen immunotherapy and provided information in case he wanted to pursue this. We also diagnosed him with acute sinusitis and started him on Biaxin 500mg  BID for two weeks. Skin testing was last done in March 2018 and was positive to grass, ragweed, weeds, trees, molds, cat, dog, and cockroach.   Since the last visit, he has done remarkably well. Campfire smoke is still a trigger. He continues to follow with Dr. Maple Hudson and remains on Trelegy. He does use his rescue inhaler prior to cold weather exposure and exercise. He does not remember the last time that he needed prednisone. He is very happy with the Trelegy. Khian's asthma has been well controlled. He has not required rescue medication, experienced nocturnal awakenings due to lower respiratory symptoms, nor have activities of daily living been limited. He has required no Emergency Department or Urgent Care visits for his asthma. He has required zero courses of systemic steroids for asthma exacerbations since the last visit. ACT score today is 19, indicating excellent asthma symptom control.   Allergies are well controlled with the current regimen. He remains on the Claritin in the morning and Allegra in the evening. He will also use the Flonase two sprays per nostril daily as well as Astelin two sprays per nostril up to twice daily. He feels that the addition for the Astelin has helped with his symptoms and he is not interested in allergy shots at this time.   He remains active  in hiking and camping, but feels that he is more inclined to take up "glamping" with his sensitivity to fire camp smoke. 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   Otherwise, there have been no changes to his past medical history, surgical  history, family history, or social history.    Review of Systems: a 14-point review of systems is pertinent for what is mentioned in HPI.  Otherwise, all other systems were negative. Constitutional: negative other than that listed in the HPI Eyes: negative other than that listed in the HPI Ears, nose, mouth, throat, and face: negative other than that listed in the HPI Respiratory: negative other than that listed in the HPI Cardiovascular: negative other than that listed in the HPI Gastrointestinal: negative other than that listed in the HPI Genitourinary: negative other than that listed in the HPI Integument: negative other than that listed in the HPI Hematologic: negative other than that listed in the HPI Musculoskeletal: negative other than that listed in the HPI Neurological: negative other than that listed in the HPI Allergy/Immunologic: negative other than that listed in the HPI    Objective:   Blood pressure 128/78, pulse (!) 55, resp. rate 16, height 5\' 11"  (1.803 m), weight 179 lb 9.6 oz (81.5 kg), SpO2 95 %. Body mass index is 25.05 kg/m.   Physical Exam:  General: Alert, interactive, in no acute distress. Pleasant interactive male.  Eyes: No conjunctival injection bilaterally, no discharge on the right, no discharge on the left and no Horner-Trantas dots present. PERRL bilaterally. EOMI without pain. No photophobia.  Ears: Hearing aids in place bilaterally, Right TM pearly Flewellen with normal light reflex, Left TM pearly Burdo with normal light reflex, Right TM intact without perforation and Left TM intact without perforation.  Nose/Throat: External nose within normal limits and septum midline. Turbinates edematous and pale with clear discharge. Posterior oropharynx erythematous without cobblestoning in the posterior oropharynx. Tonsils 2+ without exudates.  Tongue without thrush. Lungs: Clear to auscultation without wheezing, rhonchi or rales. No increased work of  breathing. CV: Normal S1/S2. No murmurs. Capillary refill <2 seconds.  Skin: Warm and dry, without lesions or rashes. Neuro:   Grossly intact. No focal deficits appreciated. Responsive to questions.  Diagnostic studies:   Spirometry: results normal (FEV1: 2.94/84%, FVC: 4.08/87%, FEV1/FVC: 72%).    Spirometry consistent with normal pattern.   Allergy Studies: none      Malachi BondsJoel Tyquasia Pant, MD [facility] [place] [place]

## 2017-05-29 NOTE — Patient Instructions (Addendum)
1. Severe persistent asthma, uncomplicated - Lung function was normal today. - Symptoms stable on Trelegy one inhalation once daily. - Continue to follow up with Dr. Maple HudsonYoung.   2. Chronic rhinitis (grass, ragweed, weeds, trees, molds, cat, dog, and cockroach) - Continue with Claritin 10mg  in the morning and Allegra 180mg  in the evening.  - Continue with Flonase two sprays per nostril once daily. - Continue with Astelin two sprays per nostril up to twice daily.  - We can consider allergen immunotherapy if there is no improvement at the next visit, although this would be difficult with your traveling.   3. Return in about 1 year (around 05/30/2018).   Please inform us of any Emergency Department visits, hospitalizations, or changes in symptoms. Call us before going to the ED for breathing or allergy symptoms since we might be able to fit you in for a sick visit. Feel free to contact us anytime with any questions, problems, or concerns.   It was a pleasure to see you again today!  Websites that have reliable patient information: 1. American Academy of Asthma, Allergy, and Immunology: www.aaaai.org 2. Food Allergy Research and Education (FARE): foodallergy.org 3. Mothers of Asthmatics: http://www.asthmacommunitynetwork.org 4. American College of Allergy, Asthma, and Immunology: www.acaai.org

## 2017-06-19 DIAGNOSIS — N189 Chronic kidney disease, unspecified: Secondary | ICD-10-CM | POA: Diagnosis not present

## 2017-06-19 DIAGNOSIS — D631 Anemia in chronic kidney disease: Secondary | ICD-10-CM | POA: Diagnosis not present

## 2017-06-19 DIAGNOSIS — E1122 Type 2 diabetes mellitus with diabetic chronic kidney disease: Secondary | ICD-10-CM | POA: Diagnosis not present

## 2017-06-19 DIAGNOSIS — N183 Chronic kidney disease, stage 3 (moderate): Secondary | ICD-10-CM | POA: Diagnosis not present

## 2017-06-19 DIAGNOSIS — N2581 Secondary hyperparathyroidism of renal origin: Secondary | ICD-10-CM | POA: Diagnosis not present

## 2017-06-19 DIAGNOSIS — M1990 Unspecified osteoarthritis, unspecified site: Secondary | ICD-10-CM | POA: Diagnosis not present

## 2017-06-19 DIAGNOSIS — J309 Allergic rhinitis, unspecified: Secondary | ICD-10-CM | POA: Diagnosis not present

## 2017-06-19 DIAGNOSIS — N261 Atrophy of kidney (terminal): Secondary | ICD-10-CM | POA: Diagnosis not present

## 2017-06-19 DIAGNOSIS — J45909 Unspecified asthma, uncomplicated: Secondary | ICD-10-CM | POA: Diagnosis not present

## 2017-08-09 ENCOUNTER — Other Ambulatory Visit: Payer: Self-pay

## 2017-08-09 DIAGNOSIS — I83893 Varicose veins of bilateral lower extremities with other complications: Secondary | ICD-10-CM

## 2017-08-28 ENCOUNTER — Ambulatory Visit (INDEPENDENT_AMBULATORY_CARE_PROVIDER_SITE_OTHER): Payer: Medicare Other | Admitting: Internal Medicine

## 2017-08-28 ENCOUNTER — Encounter: Payer: Self-pay | Admitting: Internal Medicine

## 2017-08-28 DIAGNOSIS — J449 Chronic obstructive pulmonary disease, unspecified: Secondary | ICD-10-CM

## 2017-08-28 DIAGNOSIS — J3089 Other allergic rhinitis: Secondary | ICD-10-CM

## 2017-08-28 DIAGNOSIS — J302 Other seasonal allergic rhinitis: Secondary | ICD-10-CM | POA: Diagnosis not present

## 2017-08-28 NOTE — Assessment & Plan Note (Signed)
Trelegy has worked very well for his particular pattern.  He is using rescue inhaler a little more currently as he is outdoors a lot in mid summer, but I do not think we are at a point where we need to reach for additional interventions.  We did discuss availability of "Biologics" but do not need to step up to those interventions now.

## 2017-08-28 NOTE — Progress Notes (Signed)
Subjective:    Patient ID: Robert Baker, male    DOB: 10/13/1950, 67 y.o.   MRN: [id number]  HPI M never smoker, followed for asthma and allergic rhinitis, complicated by DM, Speech pathology Swallowing-08/25/2014-high likelihood of LPR Office Spirometry 02/11/2015- WNL- FVC 91%, FEV1 87%, ratio 96%, FEF 25-75% 76% FENO 09/19/15- 116 H FENO 04/13/16- 15 ------------------------------------------------------------------------------------ 03/06/17- 67 year old male never smoker followed for asthma/bronchitis, allergic rhinitis, complicated by DM, LPR/dysphagia/GERD Asthma; Pt states Tregely is working for him but has concern with warning on label of not taking for asthma. Woud like to discuss.  We discussed asthma/bronchitis and indications for the classes of bronchodilators and ICS.  He does not have specific contraindications for LAMA.  He tried off of Trelegy for 3 days and began to note wheeze and chest tightness which resolved promptly as he resumed it. He is aware of LPR/retention in his throat during swallowing. He knows how to clear this. Not much heartburn. Discussed previous swallow eval.  Gets pneumonia vaccine from PCP Dr. Vianne Baker Robert Baker.  [facility]  08/28/2017- 67 year old male never smoker followed for asthma/bronchitis, allergic rhinitis, complicated by DM, LPR/dysphagia/GERD ----Having some congestion, productive cough yellow mucus. Proair hfa, Trelegy, astelin, flonase,  Has been outdoors a lot, [place], but avoiding campfire smoke.  He has had increased nasal congestion attributed to being outdoors a lot, but wheeze has been controlled.  No significant infections.  Using rescue inhaler once or twice daily currently.  Singulair was stopped as ineffective.  Trelegy works very well "magical".  ROS-see HPI   + = positive Constitutional:    weight loss, night sweats, fevers, chills, + fatigue, lassitude. HEENT:    headaches, difficulty  swallowing, tooth/dental problems, sore throat,       sneezing, itching, ear ache, +nasal congestion, post nasal drip, snoring CV:    chest pain, orthopnea, PND, swelling in lower extremities, anasarca,                                                          dizziness, palpitations Resp:   shortness of breath with exertion or at rest.                 productive cough,   non-productive cough, coughing up of blood.               change in color of mucus.   wheezing.   Skin:    rash or lesions. GI:  No-   heartburn, indigestion, abdominal pain, nausea, vomiting,  GU:  MS:   joint pain, stiffness,  Neuro-     nothing unusual Psych:  change in mood or affect.  depression or anxiety.   memory loss.  OBJ- Physical Exam General- Alert, Oriented, Affect-appropriate, Distress- none acute, slender Skin- rash-none, lesions- none, excoriation- none Lymphadenopathy- none Head- atraumatic            Eyes- Gross vision intact, PERRLA, conjunctivae and secretions clear            Ears- Hearing aids+            Nose- Clear, no-Septal dev, mucus, polyps, erosion, perforation             Throat- Mallampati II , mucosa clear , drainage- none,  tonsils- atrophic Neck- flexible , trachea midline, no stridor , thyroid nl, carotid no bruit Chest - symmetrical excursion , unlabored           Heart/CV- RRR , no murmur , no gallop  , no rub, nl s1 s2                           - JVD- none , edema- none, stasis changes- none, varices- none           Lung-  wheeze -none, cough- none , dullness-none, rub- none, clear           Chest wall-  Abd-  Br/ Gen/ Rectal- Not done, not indicated Extrem- cyanosis- none, clubbing, none, atrophy- none, strength- nl Neuro- grossly intact to observation

## 2017-08-28 NOTE — Assessment & Plan Note (Signed)
Nasal congestion reflecting being outdoors a lot, managed with antihistamines.  He tries to avoid additional steroids because of his diabetes but can use Flonase if needed.

## 2017-08-28 NOTE — Patient Instructions (Signed)
We can continue present meds- really glad you are doing so well.   Please call if we can help

## 2017-09-25 ENCOUNTER — Ambulatory Visit (INDEPENDENT_AMBULATORY_CARE_PROVIDER_SITE_OTHER): Payer: Medicare Other | Admitting: Vascular Surgery

## 2017-09-25 ENCOUNTER — Encounter: Payer: Self-pay | Admitting: Vascular Surgery

## 2017-09-25 ENCOUNTER — Encounter: Payer: Medicare Other | Admitting: Vascular Surgery

## 2017-09-25 ENCOUNTER — Other Ambulatory Visit: Payer: Self-pay

## 2017-09-25 ENCOUNTER — Ambulatory Visit (HOSPITAL_COMMUNITY)
Admission: RE | Admit: 2017-09-25 | Discharge: 2017-09-25 | Disposition: A | Discharge status: Home / Self Care | Payer: Medicare Other | Source: Ambulatory Visit | Attending: Vascular Surgery | Admitting: Vascular Surgery

## 2017-09-25 VITALS — BP 122/71 | HR 54 | Resp 18 | Ht 71.0 in | Wt 175.0 lb

## 2017-09-25 DIAGNOSIS — I83893 Varicose veins of bilateral lower extremities with other complications: Secondary | ICD-10-CM

## 2017-09-25 NOTE — Progress Notes (Signed)
Patient ID: Nathanal Hermiz, male   DOB: 17-Jul-1950, 67 y.o.   MRN: [id number]  Reason for Consult: New Patient (Initial Visit) (eval bil painful vv's )   Referred by Dorisann Frames, MD  Subjective:     HPI:  Sajad Glander is a 67 y.o. male without significant vascular surgical history.  He has history of leg cramps but he remains very active hiking walking biking frequently.  He is a former Clinical research associate now retired.  States that pain is worse with activity.  He has never had a blood clot does not take any blood thinners.  He has no previous history of stroke or coronary disease.  He does have varicose veins in his bilateral lower extremities and he is somewhat concerned about the appearance but mostly wants to make sure they are safe.  He has never had any venous intervention in his lower extremities.  Past Medical History:  Diagnosis Date  . Arthritis    left hand  . Asthma    daily and prn inhalers  . Chronic kidney disease   . Dental crowns present    also dental caps  . 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 High cholesterol   . History of vesicoureteral reflux   . Immature cataract   . Insulin dependent diabetes mellitus (HCC)   . Nasal congestion 01/07/2014  . Right bicipital tenosynovitis 12/2013  . Rotator cuff rupture, complete 12/2013   right shoulder  . Skin aging    states has fragile skin   Family History  Problem Relation Age of Onset  . Breast cancer Mother   . Allergic rhinitis Mother   . Other Father        respiratory failure > pulmonary edema  . Parkinsonism Brother    Past Surgical History:  Procedure Laterality Date  . KNEE ARTHROSCOPY Bilateral   . SHOULDER ARTHROSCOPY Bilateral   . SHOULDER ARTHROSCOPY WITH SUBACROMIAL DECOMPRESSION, ROTATOR CUFF REPAIR AND BICEP TENDON REPAIR Right 01/14/2014   Procedure: RIGHT SHOULDER ARTHROSCOPY DEBRIDEMENT,DISTAL CLAVICLE EXCISION,ACROMIOPLASTY,ROTATOR CUFF REPAIR,BICEPS TENODESIS;  Surgeon:  Loreta Ave, MD;  Location: [facility];  Service: Orthopedics;  Laterality: Right;  . TONSILLECTOMY  age 71  . WRIST SURGERY Right    navicular fx.    Short Social History:  Social History   Tobacco Use  . Smoking status: Never Smoker  . Smokeless tobacco: Never Used  Substance Use Topics  . Alcohol use: Yes    Comment: occasionally    Allergies  Allergen Reactions  . Chocolate Nausea Only and Other (See Comments)    GI UPSET    Current Outpatient Medications  Medication Sig Dispense Refill  . albuterol (PROAIR HFA) 108 (90 Base) MCG/ACT inhaler 2 puffs every 4-6 hours as needed 3 Inhaler 3  . azelastine (ASTELIN) 0.1 % nasal spray Place 2 sprays into both nostrils 2 (two) times daily. 90 mL 1  . BD INSULIN SYRINGE ULTRAFINE 31G X 5/16" 0.3 ML MISC     . Coenzyme Q10 (CO Q-10) 100 MG CAPS Take 3 capsules by mouth daily.     . diclofenac sodium (VOLTAREN) 1 % GEL apply 2-4 grams three times a day to four times a day to affected area OF FOOT as directed  0  . fluticasone (FLONASE ALLERGY RELIEF) 50 MCG/ACT nasal spray Place 2 sprays into both nostrils daily. 16 g 5  . Fluticasone-Umeclidin-Vilant (TRELEGY ELLIPTA) 100-62.5-25 MCG/INH AEPB Inhale 1 puff into the lungs daily.  180 each 3  . guaiFENesin (MUCINEX) 600 MG 12 hr tablet Take 600 mg by mouth 2 (two) times daily.     . insulin aspart (NOVOLOG) 100 UNIT/ML injection Inject units based on blood sugar readings throughout the day (avg of 20 units a day)    . insulin glargine (LANTUS) 100 UNIT/ML injection Inject 6 Units into the skin daily.     . Multiple Vitamin (MULTIVITAMIN) tablet Take 1 tablet by mouth daily.    Letta Pate. ONETOUCH VERIO test strip   0  . pravastatin (PRAVACHOL) 20 MG tablet Take 20 mg by mouth daily.     No current facility-administered medications for this visit.     Review of Systems  Constitutional:  Constitutional negative. HENT: HENT negative.  Respiratory: Respiratory negative.    Cardiovascular: Positive for leg swelling.  GI: Gastrointestinal negative.  Musculoskeletal: Positive for leg pain and myalgias.  Skin: Skin negative.  Neurological: Neurological negative. Hematologic: Hematologic/lymphatic negative.  Psychiatric: Psychiatric negative.        Objective:  Objective   Vitals:   09/25/17 1540  BP: 122/71  Pulse: (!) 54  Resp: 18  SpO2: 97%  Weight: 175 lb (79.4 kg)  Height: 5\' 11"  (1.803 m)   Body mass index is 24.41 kg/m.  Physical Exam  Constitutional: He appears well-developed.  HENT:  Head: Normocephalic.  Eyes: Pupils are equal, round, and reactive to light.  Neck: Normal range of motion.  Cardiovascular: Normal rate.  Pulses:      Radial pulses are 2+ on the right side, and 2+ on the left side.       Popliteal pulses are 2+ on the right side, and 2+ on the left side.       Dorsalis pedis pulses are 2+ on the right side, and 2+ on the left side.  Pulmonary/Chest: Effort normal.  Abdominal: Soft.  Musculoskeletal: Normal range of motion. He exhibits edema.  Neurological: He is alert.  Skin: Skin is warm and dry.  Psychiatric: He has a normal mood and affect. His behavior is normal. Judgment and thought content normal.        Data: I have independently interpreted his venous reflux study which demonstrates reflux of the greater saphenous vein junction only on the right 520 ms on the left 2354 ms.  The vein is 0.9 cm on the right 0.7 cm on the left but the rest is not greater than 0.35 cm throughout the saphenous veins after the junction.     Assessment/Plan:     67 year old male with bilateral lower extremity varicose veins and C2 venous disease.  He does have some musculoskeletal complaints in the lower extremities that are unrelated to vascular disease.  He is concerned about the cosmetics and I have offered him possibility of sclerotherapy.  He will call to discuss this if he has further interest.  He can otherwise follow-up  on a as needed basis.     Maeola HarmanBrandon Christopher Keldrick Pomplun MD [facility]

## 2017-10-01 DIAGNOSIS — E109 Type 1 diabetes mellitus without complications: Secondary | ICD-10-CM | POA: Diagnosis not present

## 2017-10-01 DIAGNOSIS — E7800 Pure hypercholesterolemia, unspecified: Secondary | ICD-10-CM | POA: Diagnosis not present

## 2017-10-01 DIAGNOSIS — N183 Chronic kidney disease, stage 3 (moderate): Secondary | ICD-10-CM | POA: Diagnosis not present

## 2017-10-02 DIAGNOSIS — H43813 Vitreous degeneration, bilateral: Secondary | ICD-10-CM | POA: Diagnosis not present

## 2017-10-02 DIAGNOSIS — E113393 Type 2 diabetes mellitus with moderate nonproliferative diabetic retinopathy without macular edema, bilateral: Secondary | ICD-10-CM | POA: Diagnosis not present

## 2017-10-02 DIAGNOSIS — H35373 Puckering of macula, bilateral: Secondary | ICD-10-CM | POA: Diagnosis not present

## 2017-10-02 DIAGNOSIS — H3560 Retinal hemorrhage, unspecified eye: Secondary | ICD-10-CM | POA: Diagnosis not present

## 2017-10-11 IMAGING — DX DG CHEST 2V
2 series · 2 of 2 positions shown · non-contrast
Comparison: PA and lateral chest x-ray April 13, 2016

CLINICAL DATA: Follow-up pneumonia. Persistent productive cough.
History of asthma and diabetes

EXAM:
CHEST  2 VIEW

[chest pa]
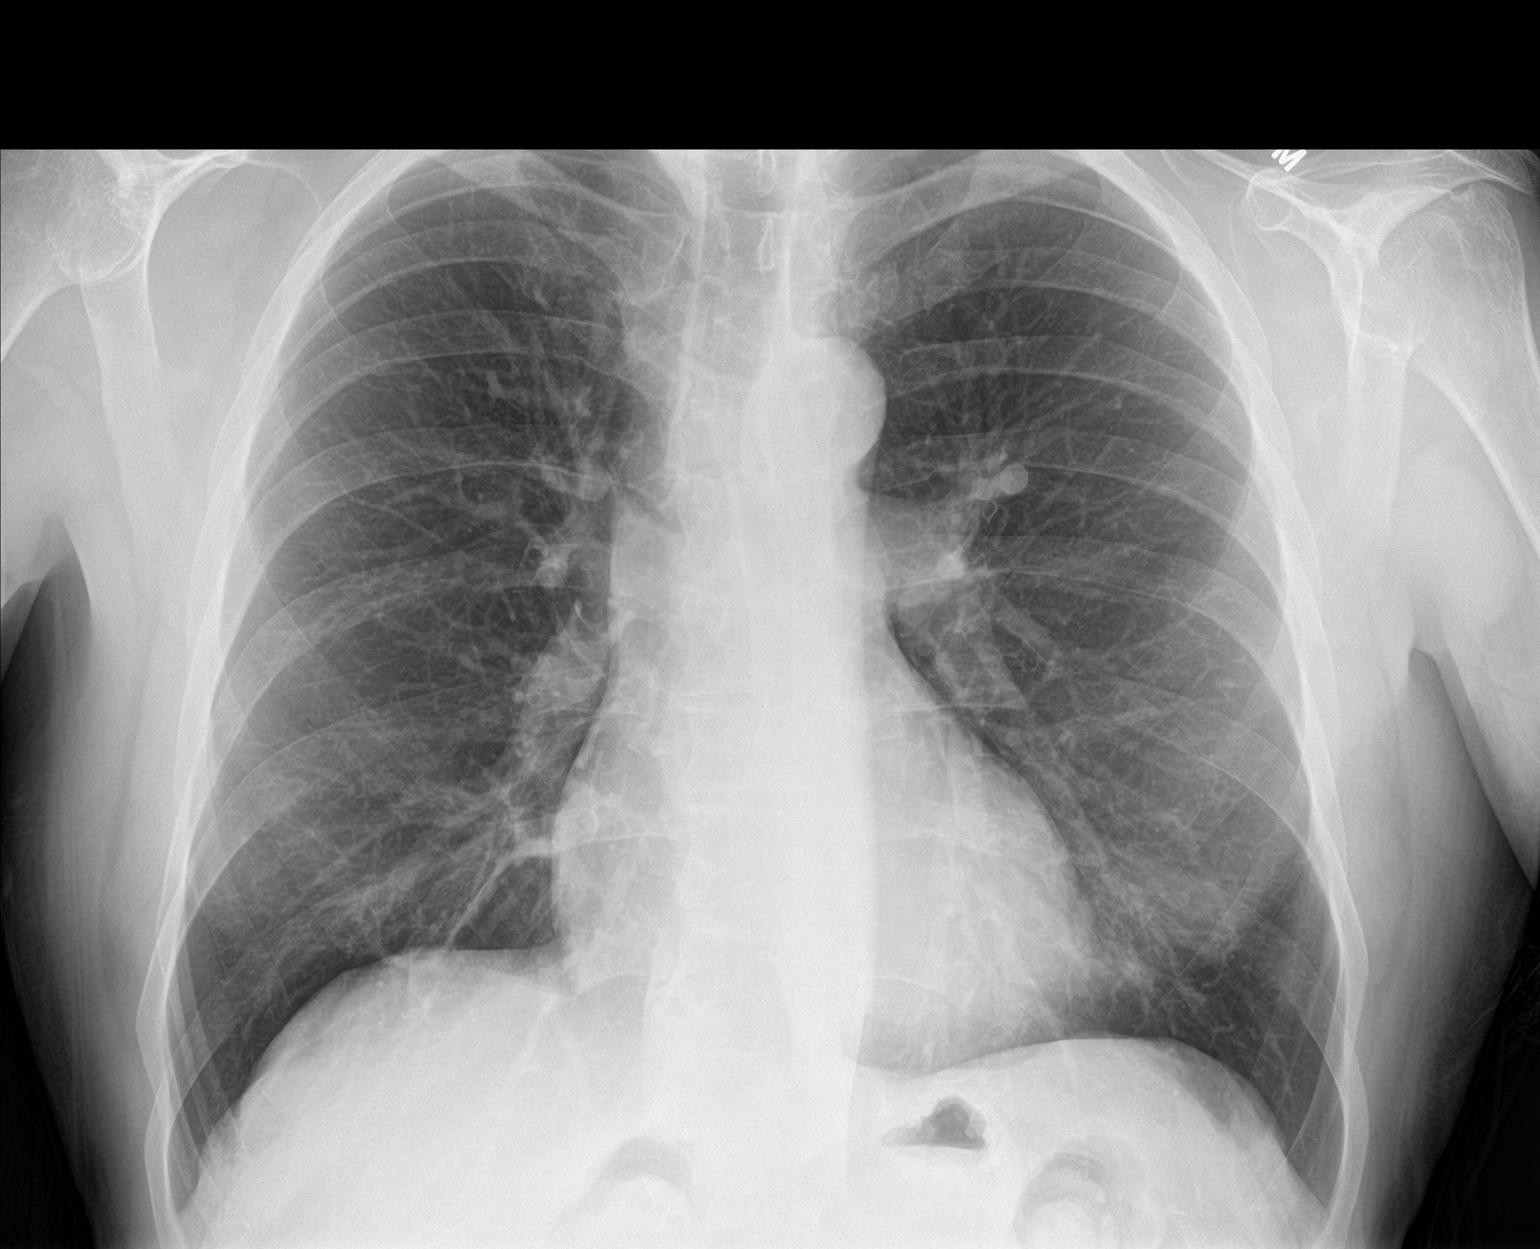

[chest lat]
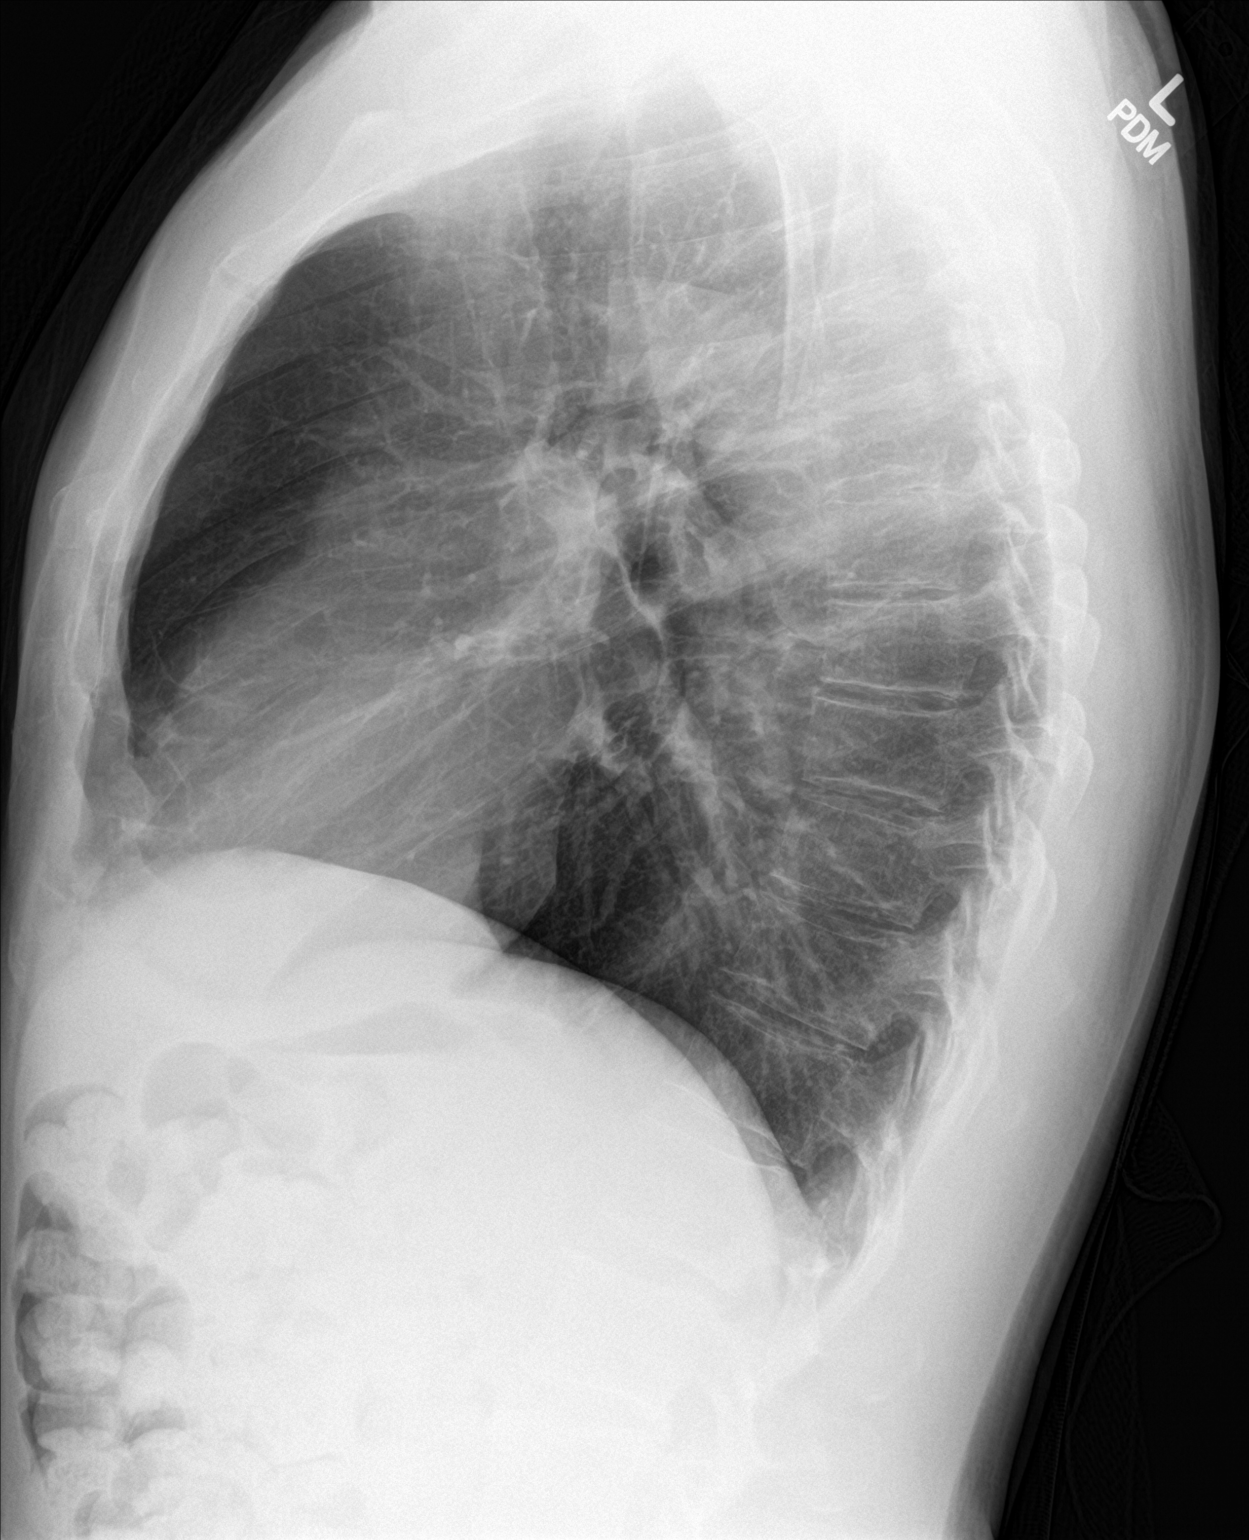

[2 of 2 positions shown; findings below may reference images not displayed]

FINDINGS: The lungs are mildly hyperinflated and clear. The heart and
pulmonary vascularity are normal. The mediastinum is normal in
width. There is no pleural effusion. The trachea is midline. The
bony thorax exhibits no acute abnormality.
IMPRESSION: Hyperinflation consistent with reactive airway disease. There is no
residual pneumonia. There is no acute cardiopulmonary abnormality.

## 2017-11-13 ENCOUNTER — Ambulatory Visit: Payer: Medicare Other

## 2017-11-27 DIAGNOSIS — Z125 Encounter for screening for malignant neoplasm of prostate: Secondary | ICD-10-CM | POA: Diagnosis not present

## 2017-11-27 DIAGNOSIS — E782 Mixed hyperlipidemia: Secondary | ICD-10-CM | POA: Diagnosis not present

## 2017-11-27 DIAGNOSIS — E109 Type 1 diabetes mellitus without complications: Secondary | ICD-10-CM | POA: Diagnosis not present

## 2017-11-28 DIAGNOSIS — N183 Chronic kidney disease, stage 3 (moderate): Secondary | ICD-10-CM | POA: Diagnosis not present

## 2017-11-28 DIAGNOSIS — Z1389 Encounter for screening for other disorder: Secondary | ICD-10-CM | POA: Diagnosis not present

## 2017-11-28 DIAGNOSIS — Z125 Encounter for screening for malignant neoplasm of prostate: Secondary | ICD-10-CM | POA: Diagnosis not present

## 2017-11-28 DIAGNOSIS — M179 Osteoarthritis of knee, unspecified: Secondary | ICD-10-CM | POA: Diagnosis not present

## 2017-11-28 DIAGNOSIS — Z23 Encounter for immunization: Secondary | ICD-10-CM | POA: Diagnosis not present

## 2017-11-28 DIAGNOSIS — Z1159 Encounter for screening for other viral diseases: Secondary | ICD-10-CM | POA: Diagnosis not present

## 2017-11-28 DIAGNOSIS — R1312 Dysphagia, oropharyngeal phase: Secondary | ICD-10-CM | POA: Diagnosis not present

## 2017-11-28 DIAGNOSIS — Z0000 Encounter for general adult medical examination without abnormal findings: Secondary | ICD-10-CM | POA: Diagnosis not present

## 2018-02-27 ENCOUNTER — Encounter: Payer: Self-pay | Admitting: Internal Medicine

## 2018-02-27 ENCOUNTER — Ambulatory Visit (INDEPENDENT_AMBULATORY_CARE_PROVIDER_SITE_OTHER): Payer: Medicare Other | Admitting: Internal Medicine

## 2018-02-27 DIAGNOSIS — J302 Other seasonal allergic rhinitis: Secondary | ICD-10-CM

## 2018-02-27 DIAGNOSIS — J449 Chronic obstructive pulmonary disease, unspecified: Secondary | ICD-10-CM

## 2018-02-27 DIAGNOSIS — J3089 Other allergic rhinitis: Secondary | ICD-10-CM | POA: Diagnosis not present

## 2018-02-27 NOTE — Progress Notes (Signed)
Subjective:    Patient ID: Robert Baker, male    DOB: [birth year], 67 y.o.   MRN: [id number]  HPI M never smoker, followed for asthma and allergic rhinitis, complicated by DM, Speech pathology Swallowing-08/25/2014-high likelihood of LPR Office Spirometry 02/11/2015- WNL- FVC 91%, FEV1 87%, ratio 96%, FEF 25-75% 76% FENO 09/19/15- 116 H FENO 04/13/16- 15 ------------------------------------------------------------------------------------ 08/28/2017- 67 year old male never smoker followed for asthma/bronchitis, allergic rhinitis, complicated by DM, LPR/dysphagia/GERD ----Having some congestion, productive cough yellow mucus. Proair hfa, Trelegy, astelin, flonase,  Has been outdoors a lot, including spending 2 weeks at [facility], but avoiding campfire smoke.  He has had increased nasal congestion attributed to being outdoors a lot, but wheeze has been controlled.  No significant infections.  Using rescue inhaler once or twice daily currently.  Singulair was stopped as ineffective.  Trelegy works very well "magical".  02/27/18- 67 year old male never smoker followed for asthma/bronchitis, allergic rhinitis, complicated by DM, LPR/dysphagia/GERD -----Asthmatic Bronchitis: Pt states he is doing well overall; slight congestion in throat-uses salt water rinses when needed to help with this.  Forgot his hearing aids today. No acute episodes.  He is comfortable with his current medicines and still considers Trelegy very effective.  He did not go camping at his [place] this year, thus avoiding smoke, pollen and other significant triggers.  ROS-see HPI   + = positive Constitutional:    weight loss, night sweats, fevers, chills, + fatigue, lassitude. HEENT:    headaches, difficulty swallowing, tooth/dental problems, sore throat,       sneezing, itching, ear ache, +nasal congestion, post nasal drip, snoring CV:    chest pain, orthopnea, PND, swelling in lower extremities, anasarca,                                                           dizziness, palpitations Resp:   shortness of breath with exertion or at rest.                 productive cough,   non-productive cough, coughing up of blood.               change in color of mucus.   wheezing.   Skin:    rash or lesions. GI:  No-   heartburn, indigestion, abdominal pain, nausea, vomiting,  GU:  MS:   joint pain, stiffness,  Neuro-     nothing unusual Psych:  change in mood or affect.  depression or anxiety.   memory loss.  OBJ- Physical Exam General- Alert, Oriented, Affect-appropriate, Distress- none acute, slender Skin- rash-none, lesions- none, excoriation- none Lymphadenopathy- none Head- atraumatic            Eyes- Gross vision intact, PERRLA, conjunctivae and secretions clear            Ears- Hearing aids+            Nose- Clear, no-Septal dev, mucus, polyps, erosion, perforation             Throat- Mallampati II , mucosa clear , drainage- none, tonsils- atrophic Neck- flexible , trachea midline, no stridor , thyroid nl, carotid no bruit Chest - symmetrical excursion , unlabored           Heart/CV- RRR , no murmur , no gallop  , no rub, nl s1  s2                           - JVD- none , edema- none, stasis changes- none, varices- none           Lung-  wheeze -none, cough- none , dullness-none, rub- none, clear           Chest wall-  Abd-  Br/ Gen/ Rectal- Not done, not indicated Extrem- cyanosis- none, clubbing, none, atrophy- none, strength- nl Neuro- grossly intact to observation

## 2018-02-27 NOTE — Assessment & Plan Note (Signed)
Mild occasional nasal congestion and mucus in throat.  He feels he manages this well with nasal saline.  He can use Flonase and a nonsedating antihistamine if needed.

## 2018-02-27 NOTE — Assessment & Plan Note (Signed)
He has had good control with Trelegy and occasional use of rescue inhaler.  [place] and [place] smoke has helped a lot.  Next step would be a biologic agent if needed but currently I do not think we need to go up to that.  Medications were reviewed.  No changes indicated.

## 2018-02-27 NOTE — Patient Instructions (Signed)
We can continue present meds.  Please let us know if we we can help or you need refills

## 2018-03-03 ENCOUNTER — Other Ambulatory Visit: Payer: Self-pay | Admitting: Allergy & Immunology

## 2018-03-11 ENCOUNTER — Encounter: Payer: Self-pay | Admitting: Vascular Surgery

## 2018-03-11 ENCOUNTER — Ambulatory Visit (INDEPENDENT_AMBULATORY_CARE_PROVIDER_SITE_OTHER): Payer: Self-pay | Admitting: *Deleted

## 2018-03-11 DIAGNOSIS — I781 Nevus, non-neoplastic: Secondary | ICD-10-CM

## 2018-03-11 DIAGNOSIS — I8393 Asymptomatic varicose veins of bilateral lower extremities: Secondary | ICD-10-CM

## 2018-03-11 NOTE — Progress Notes (Signed)
X=.3% Sotradecol administered with a 27g butterfly.  Patient received a total of 12cc.  Treated               all areas of concern for this nice man. Easy access. Tolerated very well. Anticipate good results. Follow prn.  Photos: Yes.    Compression stockings applied: Yes.

## 2018-03-28 ENCOUNTER — Ambulatory Visit: Payer: Medicare Other

## 2018-06-12 ENCOUNTER — Other Ambulatory Visit: Payer: Self-pay | Admitting: Internal Medicine

## 2018-06-12 MED ORDER — FLUTICASONE-UMECLIDIN-VILANT 100-62.5-25 MCG/INH IN AEPB: 1.0000 | INHALATION_SPRAY | Freq: Every day | RESPIRATORY_TRACT | 3 refills | Status: DC

## 2018-06-12 NOTE — Progress Notes (Signed)
Refill Trelegy e-sent to [facility]

## 2018-07-22 ENCOUNTER — Telehealth: Payer: Self-pay | Admitting: Internal Medicine

## 2018-07-22 MED ORDER — ALBUTEROL SULFATE HFA 108 (90 BASE) MCG/ACT IN AERS: INHALATION_SPRAY | RESPIRATORY_TRACT | 3 refills | Status: DC

## 2018-07-22 NOTE — Telephone Encounter (Signed)
Refill of albuterol inhaler sent to preferred pharmacy for pt. Called and spoke with pt letting him know this had been done and pt verbalized understanding. Nothing further needed.

## 2018-09-17 DIAGNOSIS — M1990 Unspecified osteoarthritis, unspecified site: Secondary | ICD-10-CM | POA: Diagnosis not present

## 2018-09-17 DIAGNOSIS — N189 Chronic kidney disease, unspecified: Secondary | ICD-10-CM | POA: Diagnosis not present

## 2018-09-17 DIAGNOSIS — D631 Anemia in chronic kidney disease: Secondary | ICD-10-CM | POA: Diagnosis not present

## 2018-09-17 DIAGNOSIS — N2581 Secondary hyperparathyroidism of renal origin: Secondary | ICD-10-CM | POA: Diagnosis not present

## 2018-09-17 DIAGNOSIS — J45909 Unspecified asthma, uncomplicated: Secondary | ICD-10-CM | POA: Diagnosis not present

## 2018-09-17 DIAGNOSIS — J309 Allergic rhinitis, unspecified: Secondary | ICD-10-CM | POA: Diagnosis not present

## 2018-09-17 DIAGNOSIS — N261 Atrophy of kidney (terminal): Secondary | ICD-10-CM | POA: Diagnosis not present

## 2018-09-17 DIAGNOSIS — E1122 Type 2 diabetes mellitus with diabetic chronic kidney disease: Secondary | ICD-10-CM | POA: Diagnosis not present

## 2018-09-17 DIAGNOSIS — N183 Chronic kidney disease, stage 3 (moderate): Secondary | ICD-10-CM | POA: Diagnosis not present

## 2018-11-11 ENCOUNTER — Other Ambulatory Visit: Payer: Self-pay | Admitting: Allergy & Immunology

## 2018-12-11 ENCOUNTER — Encounter: Payer: Self-pay | Admitting: Allergy & Immunology

## 2018-12-11 ENCOUNTER — Ambulatory Visit (INDEPENDENT_AMBULATORY_CARE_PROVIDER_SITE_OTHER): Payer: Medicare Other | Admitting: Allergy & Immunology

## 2018-12-11 ENCOUNTER — Other Ambulatory Visit: Payer: Self-pay

## 2018-12-11 DIAGNOSIS — J3089 Other allergic rhinitis: Secondary | ICD-10-CM

## 2018-12-11 DIAGNOSIS — J4550 Severe persistent asthma, uncomplicated: Secondary | ICD-10-CM | POA: Diagnosis not present

## 2018-12-11 DIAGNOSIS — J302 Other seasonal allergic rhinitis: Secondary | ICD-10-CM | POA: Diagnosis not present

## 2018-12-11 MED ORDER — AZELASTINE HCL 0.1 % NA SOLN: 2.0000 | Freq: Two times a day (BID) | NASAL | 2 refills | Status: DC

## 2018-12-11 NOTE — Patient Instructions (Addendum)
1. Severe persistent asthma, uncomplicated - We did not do lung testing today. - But it seems that your symptoms are stable on Trelegy one inhalation once daily. - Continue to follow up with Dr. Annamaria Boots.   2. Chronic rhinitis (grass, ragweed, weeds, trees, molds, cat, dog, and cockroach) - Continue with Allegra 180mg  in the evening.  - Continue with Flonase two sprays per nostril once daily. - Continue with Astelin two sprays per nostril up to twice daily.  - We can consider allergen immunotherapy if there is no improvement or you get tired of taking your medications.    3. Return in about 1 year (around 12/11/2019). This can be an in-person, a virtual Webex or a telephone follow up visit.   Please inform us of any Emergency Department visits, hospitalizations, or changes in symptoms. Call us before going to the ED for breathing or allergy symptoms since we might be able to fit you in for a sick visit. Feel free to contact us anytime with any questions, problems, or concerns.  It was a pleasure to talk to you today today!  Websites that have reliable patient information: 1. American Academy of Asthma, Allergy, and Immunology: www.aaaai.org 2. Food Allergy Research and Education (FARE): foodallergy.org 3. Mothers of Asthmatics: http://www.asthmacommunitynetwork.org 4. American College of Allergy, Asthma, and Immunology: www.acaai.org  "Like" [place] on Facebook and Instagram for our latest updates!      Make sure you are registered to vote! If you have moved or changed any of your contact information, you will need to get this updated before voting!  In some cases, you MAY be able to register to vote online: [email or web address].[email or web address]    Voter ID laws are NOT going into effect for the General Election in November 2020! DO NOT let this stop you from exercising your right to vote!   Absentee voting is the SAFEST way to vote during the coronavirus pandemic!   Download  and print an absentee ballot request form at [email or web address]-[email or web address]-Request or you can scan the QR code below with your smart phone:      More information on absentee ballots can be found here: [email or web address]  [place] VOTING SITES have been established! You can register to vote and cast your vote on the same day at these locations. See this site for more information on what you need to register to vote: [email or web address]

## 2018-12-11 NOTE — Progress Notes (Signed)
RE: Robert Baker MRN: [id number] DOB: Nov 09, 1950 Date of Telemedicine Visit: 12/11/2018  Referring provider: Dorisann FramesBalan, Bindubal, MD Primary care provider: Dorisann FramesBalan, Bindubal, MD  Chief Complaint: Follow-up   Telemedicine Follow Up Visit via Telephone: I connected with Robert Baker for a follow up on 12/11/18 by telephone and verified that I am speaking with the correct person using two identifiers.   I discussed the limitations, risks, security and privacy concerns of performing an evaluation and management service by telephone and the availability of in person appointments. I also discussed with the patient that there may be a patient responsible charge related to this service. The patient expressed understanding and agreed to proceed.  Patient is at home.  Provider is at the office.  Visit start time: 4:11 PM Visit end time: 4:37 PM [facility] and medical assistant/nurse: Ashleigh  History of Present Illness:  He is a 68 y.o. male, who is being followed for perennial and seasonal allergic rhinitis.His previous allergy office visit was in March 2019 with myself.  At that time, he was doing well.  We continued with Allegra 180 mg in the evening, Flonase 2 sprays per nostril daily, Astelin 2 sprays per nostril daily, and nasal saline rinses as needed.  We did discuss allergen immunotherapy for long-term control.  His asthma was under good control with Trelegy 1 puff once daily.  He was followed by Dr. Maple HudsonYoung.  Since last visit, he has done very well.  He has remained safe during the coronavirus pandemic.  Asthma/Respiratory Symptom History: He remains on the Trelegy one puff once daily. He sees Dr. Maple HudsonYoung who manages his breathing. He sees him annually. He has not needed steroids for his breathing.  Robert Baker asthma has been well controlled. He has not required rescue medication, experienced nocturnal awakenings due to lower respiratory symptoms, nor have  activities of daily living been limited. He has required no Emergency Department or Urgent Care visits for his asthma. He has required zero courses of systemic steroids for asthma exacerbations since the last visit. ACT score today is 20, indicating excellent asthma symptom control.   Allergic Rhinitis Symptom History: He remains on the Allegra, Flonase, and Astelin.  He gets the Flonase and the Allegra over-the-counter.  He does need a new prescription for the Astelin.  He has not needed antibiotics for sinus infection since last visit, although he was recently placed on penicillin for a tooth abscess.   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  Before the coronavirus pandemic, [place]  Otherwise, there have been no changes to his past medical history, surgical history, family history, or social history.  Assessment and Plan:  Robert Baker is a 68 y.o. male with:  Severe persistent asthma, uncomplicated  Chronic nonseasonal allergic rhinitis(grass, ragweed, weeds, trees, molds, cat, dog, cockroach)   Asthma Reportables: Severity:severe persistent Risk:low Control:well controlled    1. Severe persistent asthma, uncomplicated - We did not do lung testing today. - But it seems that your symptoms are stable on Trelegy one inhalation once daily. - Continue to follow up with Dr. Maple HudsonYoung.   2. Chronic rhinitis (grass, ragweed, weeds, trees, molds, cat, dog, and cockroach) - Continue with Allegra 180mg  in the evening.  - Continue with Flonase two sprays per nostril once daily. - Continue with Astelin two sprays per nostril up to twice daily.  -  We can consider allergen immunotherapy if there is no improvement or you get tired of taking your medications.    3. Return in about 1 year (around 12/11/2019). This can be an in-person, a virtual Webex or a telephone follow up visit.   Diagnostics: None.  Medication List:  Current Outpatient Medications  Medication Sig Dispense Refill  . albuterol (PROAIR HFA) 108 (90 Base) MCG/ACT inhaler 2 puffs every 4-6 hours as needed 3 Inhaler 3  . azelastine (ASTELIN) 0.1 % nasal spray Place 2 sprays into both nostrils 2 (two) times daily. Use in each nostril as directed 90 mL 2  . BD INSULIN SYRINGE ULTRAFINE 31G X 5/16" 0.3 ML MISC     . Coenzyme Q10 (CO Q-10) 100 MG CAPS Take 3 capsules by mouth daily.     . diclofenac sodium (VOLTAREN) 1 % GEL apply 2-4 grams three times a day to four times a day to affected area OF FOOT as directed  0  . fluticasone (FLONASE ALLERGY RELIEF) 50 MCG/ACT nasal spray Place 2 sprays into both nostrils daily. 16 g 5  . Fluticasone-Umeclidin-Vilant (TRELEGY ELLIPTA) 100-62.5-25 MCG/INH AEPB Inhale 1 puff into the lungs daily. 180 each 3  . guaiFENesin (MUCINEX) 600 MG 12 hr tablet Take 600 mg by mouth 2 (two) times daily.     . insulin aspart (NOVOLOG) 100 UNIT/ML injection Inject units based on blood sugar readings throughout the day (avg of 20 units a day)    . insulin glargine (LANTUS) 100 UNIT/ML injection Inject 6 Units into the skin daily.     . Magnesium 400 MG TABS Take by mouth.    . Multiple Vitamin (MULTIVITAMIN) tablet Take 1 tablet by mouth daily.    Letta Pate VERIO test strip   0  . pravastatin (PRAVACHOL) 20 MG tablet Take 20 mg by mouth daily.     No current facility-administered medications for this visit.    Allergies: Allergies  Allergen Reactions  . Chocolate Nausea Only and Other (See Comments)    GI UPSET   I reviewed his past medical history, social history, family history, and environmental history and no significant changes have been reported from previous visits.  Review of Systems  Constitutional: Negative for chills, diaphoresis, fatigue and fever.  HENT: Negative for congestion, ear discharge, ear pain, facial swelling, postnasal drip, rhinorrhea, sinus  pressure, sinus pain and sore throat.   Eyes: Negative for photophobia, pain, discharge, redness and itching.  Respiratory: Negative for apnea, cough, choking, chest tightness, shortness of breath, wheezing and stridor.   Cardiovascular: Negative for chest pain.  Gastrointestinal: Negative for diarrhea and nausea.  Endocrine: Negative for cold intolerance and heat intolerance.  Musculoskeletal: Negative for arthralgias and myalgias.  Skin: Negative for rash.  Allergic/Immunologic: Negative for environmental allergies, food allergies and immunocompromised state.    Objective:  Physical exam not obtained as encounter was done via telephone.   Previous notes and tests were reviewed.  I discussed the assessment and treatment plan with the patient. The patient was provided an opportunity to ask questions and all were answered. The patient agreed with the plan and demonstrated an understanding of the instructions.   The patient was advised to call back or seek an in-person evaluation if the symptoms worsen or if the condition fails to improve as anticipated.  I provided 26 minutes of non-face-to-face time during this encounter.  It was my pleasure to participate in Larnce Swecker care today. Please feel free to contact me with  any questions or concerns.   Sincerely,  Valentina Shaggy, MD

## 2019-01-06 DIAGNOSIS — Z0000 Encounter for general adult medical examination without abnormal findings: Secondary | ICD-10-CM | POA: Diagnosis not present

## 2019-01-16 DIAGNOSIS — Z1159 Encounter for screening for other viral diseases: Secondary | ICD-10-CM | POA: Diagnosis not present

## 2019-01-16 DIAGNOSIS — Z125 Encounter for screening for malignant neoplasm of prostate: Secondary | ICD-10-CM | POA: Diagnosis not present

## 2019-01-16 DIAGNOSIS — R1312 Dysphagia, oropharyngeal phase: Secondary | ICD-10-CM | POA: Diagnosis not present

## 2019-01-16 DIAGNOSIS — M179 Osteoarthritis of knee, unspecified: Secondary | ICD-10-CM | POA: Diagnosis not present

## 2019-03-03 ENCOUNTER — Ambulatory Visit (INDEPENDENT_AMBULATORY_CARE_PROVIDER_SITE_OTHER): Payer: Medicare Other | Admitting: Internal Medicine

## 2019-03-03 ENCOUNTER — Encounter: Payer: Self-pay | Admitting: Internal Medicine

## 2019-03-03 DIAGNOSIS — J449 Chronic obstructive pulmonary disease, unspecified: Secondary | ICD-10-CM

## 2019-03-03 DIAGNOSIS — J302 Other seasonal allergic rhinitis: Secondary | ICD-10-CM | POA: Diagnosis not present

## 2019-03-03 DIAGNOSIS — J3089 Other allergic rhinitis: Secondary | ICD-10-CM

## 2019-03-03 NOTE — Assessment & Plan Note (Signed)
Moderate persistent uncomplicated. Trelegy works very well. Not challenged by campfire smoke and wilderness exposures this year, without travel to [place]. Plan- call for refills. Covid vaccine when avail.

## 2019-03-03 NOTE — Progress Notes (Signed)
Subjective:    Patient ID: Robert Baker, male    DOB: [birth year], 68 y.o.   MRN: [id number]  HPI M never smoker, followed for asthma and allergic rhinitis, complicated by DM, Speech pathology Swallowing-08/25/2014-high likelihood of LPR Office Spirometry 02/11/2015- WNL- FVC 91%, FEV1 87%, ratio 96%, FEF 25-75% 76% FENO 09/19/15- 116 H FENO 04/13/16- 15 ------------------------------------------------------------------------------------  02/27/18- 68 year old male never smoker followed for asthma/bronchitis, allergic rhinitis, complicated by DM, LPR/dysphagia/GERD -----Asthmatic Bronchitis: Pt states he is doing well overall; slight congestion in throat-uses salt water rinses when needed to help with this.  Forgot his hearing aids today. No acute episodes.  He is comfortable with his current medicines and still considers Trelegy very effective.  He did not go camping at his [place] this year, thus avoiding smoke, pollen and other significant triggers.  03/03/2019- Virtual Visit via Telephone Note  I connected with [name] on 03/03/19 at  9:30 AM EST by telephone and verified that I am speaking with the correct person using two identifiers.  Location: Patient: H Provider: O   I discussed the limitations, risks, security and privacy concerns of performing an evaluation and management service by telephone and the availability of in person appointments. I also discussed with the patient that there may be a patient responsible charge related to this service. The patient expressed understanding and agreed to proceed.   History of Present Illness: 68 year old male never smoker followed for asthma/bronchitis, allergic rhinitis, complicated by DM 1, LPR/dysphagia/GERD Trelegy, Astelin, Proair hfa, He reports stable control of asthma. Trelegy still works very well. Uses rescue inhaler about 1x/ daily before exercise. No acute exacerbation. Persistent very chronic mild nasal congestion.  astelin helps.  Did not go to [place] this year. Being very Covid careful- discussed vaccine.  Had flu vax Observations/Objective:   Assessment and Plan: Asthma- moderate persistent uncomplicated- no changes Allergic rhinitis- controlled perennial Follow Up Instructions: 1 year   I discussed the assessment and treatment plan with the patient. The patient was provided an opportunity to ask questions and all were answered. The patient agreed with the plan and demonstrated an understanding of the instructions.   The patient was advised to call back or seek an in-person evaluation if the symptoms worsen or if the condition fails to improve as anticipated.  I provided 18 minutes of non-face-to-face time during this encounter.   [name], MD       ROS-see HPI   + = positive Constitutional:    weight loss, night sweats, fevers, chills, + fatigue, lassitude. HEENT:    headaches, difficulty swallowing, tooth/dental problems, sore throat,       sneezing, itching, ear ache, +nasal congestion, post nasal drip, snoring CV:    chest pain, orthopnea, PND, swelling in lower extremities, anasarca,                                                          dizziness, palpitations Resp:   shortness of breath with exertion or at rest.                 productive cough,   non-productive cough, coughing up of blood.               change in color of mucus.   wheezing.   Skin:  rash or lesions. GI:  No-   heartburn, indigestion, abdominal pain, nausea, vomiting,  GU:  MS:   joint pain, stiffness,  Neuro-     nothing unusual Psych:  change in mood or affect.  depression or anxiety.   memory loss.  OBJ- Physical Exam General- Alert, Oriented, Affect-appropriate, Distress- none acute, slender Skin- rash-none, lesions- none, excoriation- none Lymphadenopathy- none Head- atraumatic            Eyes- Gross vision intact, PERRLA, conjunctivae and secretions clear            Ears- Hearing aids+             Nose- Clear, no-Septal dev, mucus, polyps, erosion, perforation             Throat- Mallampati II , mucosa clear , drainage- none, tonsils- atrophic Neck- flexible , trachea midline, no stridor , thyroid nl, carotid no bruit Chest - symmetrical excursion , unlabored           Heart/CV- RRR , no murmur , no gallop  , no rub, nl s1 s2                           - JVD- none , edema- none, stasis changes- none, varices- none           Lung-  wheeze -none, cough- none , dullness-none, rub- none, clear           Chest wall-  Abd-  Br/ Gen/ Rectal- Not done, not indicated Extrem- cyanosis- none, clubbing, none, atrophy- none, strength- nl Neuro- grossly intact to observation

## 2019-03-03 NOTE — Patient Instructions (Signed)
We can continue current meds. You can call for refills when needed.  Please cal if we can help

## 2019-03-03 NOTE — Assessment & Plan Note (Signed)
Perennial nasal congestion unchanged. He works with his allergist. Astelin helps some when used.

## 2019-03-30 DIAGNOSIS — E109 Type 1 diabetes mellitus without complications: Secondary | ICD-10-CM | POA: Diagnosis not present

## 2019-03-30 DIAGNOSIS — N183 Chronic kidney disease, stage 3 unspecified: Secondary | ICD-10-CM | POA: Diagnosis not present

## 2019-03-30 DIAGNOSIS — E7800 Pure hypercholesterolemia, unspecified: Secondary | ICD-10-CM | POA: Diagnosis not present

## 2019-04-02 DIAGNOSIS — E109 Type 1 diabetes mellitus without complications: Secondary | ICD-10-CM | POA: Diagnosis not present

## 2019-04-02 DIAGNOSIS — E7800 Pure hypercholesterolemia, unspecified: Secondary | ICD-10-CM | POA: Diagnosis not present

## 2019-04-02 DIAGNOSIS — N183 Chronic kidney disease, stage 3 unspecified: Secondary | ICD-10-CM | POA: Diagnosis not present

## 2019-04-21 ENCOUNTER — Ambulatory Visit: Payer: Medicare Other

## 2019-05-01 ENCOUNTER — Ambulatory Visit: Payer: Medicare Other | Attending: Internal Medicine

## 2019-05-01 DIAGNOSIS — Z23 Encounter for immunization: Secondary | ICD-10-CM | POA: Insufficient documentation

## 2019-05-01 NOTE — Progress Notes (Signed)
   Covid-19 Vaccination Clinic  Name:  Robert Baker    MRN: [id number] DOB: Jan 10, 1951  05/01/2019  [name] was observed post Covid-19 immunization for 15 minutes without incidence. He was provided with Vaccine Information Sheet and instruction to access the V-Safe system.   [name] was instructed to call 911 with any severe reactions post vaccine: Marland Kitchen Difficulty breathing  . Swelling of your face and throat  . A fast heartbeat  . A bad rash all over your body  . Dizziness and weakness    Immunizations Administered    Name Date Dose VIS Date Route   Pfizer COVID-19 Vaccine 05/01/2019  8:28 AM 0.3 mL 03/06/2019 Intramuscular   Manufacturer: [employer], [employer]   Lot: [id number]   NDC: [address]-6

## 2019-05-12 ENCOUNTER — Ambulatory Visit: Payer: Medicare Other

## 2019-05-26 ENCOUNTER — Ambulatory Visit: Payer: Medicare Other | Attending: Internal Medicine

## 2019-05-26 NOTE — Progress Notes (Unsigned)
   Covid-19 Vaccination Clinic  Name:  Robert Baker    MRN: [id number] DOB: [birth year]  05/26/2019  [name] was observed post Covid-19 immunization for 15 minutes without incident. He was provided with Vaccine Information Sheet and instruction to access the V-Safe system.   [name] was instructed to call 911 with any severe reactions post vaccine: Marland Kitchen Difficulty breathing  . Swelling of face and throat  . A fast heartbeat  . A bad rash all over body  . Dizziness and weakness    *** Covid vaccine administration is NOT RECORDED.  Must document administration and refresh note before signing ***

## 2019-06-29 DIAGNOSIS — E109 Type 1 diabetes mellitus without complications: Secondary | ICD-10-CM | POA: Diagnosis not present

## 2019-06-29 DIAGNOSIS — E7800 Pure hypercholesterolemia, unspecified: Secondary | ICD-10-CM | POA: Diagnosis not present

## 2019-06-29 DIAGNOSIS — N183 Chronic kidney disease, stage 3 unspecified: Secondary | ICD-10-CM | POA: Diagnosis not present

## 2019-06-29 DIAGNOSIS — R32 Unspecified urinary incontinence: Secondary | ICD-10-CM | POA: Diagnosis not present

## 2019-08-05 DIAGNOSIS — R351 Nocturia: Secondary | ICD-10-CM | POA: Diagnosis not present

## 2019-08-05 DIAGNOSIS — R3914 Feeling of incomplete bladder emptying: Secondary | ICD-10-CM | POA: Diagnosis not present

## 2019-08-05 DIAGNOSIS — N401 Enlarged prostate with lower urinary tract symptoms: Secondary | ICD-10-CM | POA: Diagnosis not present

## 2019-08-19 DIAGNOSIS — N1330 Unspecified hydronephrosis: Secondary | ICD-10-CM | POA: Diagnosis not present

## 2019-08-19 DIAGNOSIS — N401 Enlarged prostate with lower urinary tract symptoms: Secondary | ICD-10-CM | POA: Diagnosis not present

## 2019-08-19 DIAGNOSIS — R351 Nocturia: Secondary | ICD-10-CM | POA: Diagnosis not present

## 2019-08-19 DIAGNOSIS — R3914 Feeling of incomplete bladder emptying: Secondary | ICD-10-CM | POA: Diagnosis not present

## 2019-08-28 ENCOUNTER — Other Ambulatory Visit: Payer: Self-pay | Admitting: Internal Medicine

## 2019-09-01 DIAGNOSIS — R3914 Feeling of incomplete bladder emptying: Secondary | ICD-10-CM | POA: Diagnosis not present

## 2019-09-02 ENCOUNTER — Telehealth: Payer: Self-pay | Admitting: Internal Medicine

## 2019-09-02 DIAGNOSIS — N401 Enlarged prostate with lower urinary tract symptoms: Secondary | ICD-10-CM | POA: Diagnosis not present

## 2019-09-02 DIAGNOSIS — R3914 Feeling of incomplete bladder emptying: Secondary | ICD-10-CM | POA: Diagnosis not present

## 2019-09-02 MED ORDER — TRELEGY ELLIPTA 100-62.5-25 MCG/INH IN AEPB
1.0000 | INHALATION_SPRAY | Freq: Every day | RESPIRATORY_TRACT | 0 refills | Status: DC
Start: 2019-09-02 — End: 2019-09-14

## 2019-09-02 MED ORDER — TRELEGY ELLIPTA 100-62.5-25 MCG/INH IN AEPB: 1.0000 | INHALATION_SPRAY | Freq: Every day | RESPIRATORY_TRACT | 3 refills | Status: DC

## 2019-09-02 MED ORDER — TRELEGY ELLIPTA 100-62.5-25 MCG/INH IN AEPB: INHALATION_SPRAY | RESPIRATORY_TRACT | 3 refills | Status: DC

## 2019-09-02 NOTE — Telephone Encounter (Signed)
Patient refill script was sent incorrectly for 30 days instead of 90 days. Patient is very upset. Attempted to call [facility] to fix the problem and they refused to accept a new script for 90 days. Patient provided with samples to cover the money he lost from our mistake. Patient will pick up samples at our office this week.

## 2019-09-04 ENCOUNTER — Other Ambulatory Visit: Payer: Self-pay | Admitting: Urology

## 2019-09-18 ENCOUNTER — Telehealth: Payer: Self-pay | Admitting: Internal Medicine

## 2019-09-18 NOTE — Telephone Encounter (Signed)
Yes, thanks
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
## 2019-09-18 NOTE — Telephone Encounter (Signed)
Spoke with pt's wife. Pt has been scheduled with CY on 09/21/19 at 1130. Nothing further was needed.

## 2019-09-21 ENCOUNTER — Encounter: Payer: Self-pay | Admitting: Internal Medicine

## 2019-09-21 ENCOUNTER — Ambulatory Visit (INDEPENDENT_AMBULATORY_CARE_PROVIDER_SITE_OTHER): Payer: Medicare Other | Admitting: Internal Medicine

## 2019-09-21 ENCOUNTER — Other Ambulatory Visit: Payer: Self-pay

## 2019-09-21 DIAGNOSIS — E1040 Type 1 diabetes mellitus with diabetic neuropathy, unspecified: Secondary | ICD-10-CM | POA: Diagnosis not present

## 2019-09-21 DIAGNOSIS — J209 Acute bronchitis, unspecified: Secondary | ICD-10-CM

## 2019-09-21 MED ORDER — TRELEGY ELLIPTA 200-62.5-25 MCG/INH IN AEPB
1.0000 | INHALATION_SPRAY | Freq: Every day | RESPIRATORY_TRACT | 0 refills | Status: DC
Start: 2019-09-21 — End: 2020-03-02
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
## 2019-09-21 NOTE — Patient Instructions (Addendum)
Sample x 2 Trelegy 200    Inhale 1 puff then rinse mouth, once daily. When these samples run out, go back to Trelegy 100.  Continue nasal rinses while needed  I hope things go smoothly with your surgery  Please call if we can help  We mentioned a book called [employer] by Esther Hardy

## 2019-09-22 NOTE — Progress Notes (Addendum)
COVID Vaccine Completed: 3-21 Date COVID Vaccine completed: COVID vaccine manufacturer: Pfizer    Moderna   [employer]   PCP - Dorisann Frames, MD Cardiologist - N/A  No stimulator for pain control   Chest x-ray -  EKG -  Stress Test -  ECHO -  Cardiac Cath -   Sleep Study -  CPAP -   Fasting Blood Sugar - 70-250 Checks Blood Sugar __4___ times a day  Blood Thinner Instructions: Aspirin Instructions: Last Dose:  Anesthesia review:   ADL's with some difficult due to Asthma-Saw Dr. Levonne Spiller Young-Medications changes made   Patient denies shortness of breath, fever, cough and chest pain at PAT appointment   Patient verbalized understanding of instructions that were given to them at the PAT appointment. Patient was also instructed that they will need to review over the PAT instructions again at home before surgery.

## 2019-09-22 NOTE — Patient Instructions (Addendum)
DUE TO COVID-19 ONLY ONE VISITOR IS ALLOWED TO COME WITH YOU AND STAY IN THE WAITING ROOM ONLY DURING PRE OP AND PROCEDURE DAY OF SURGERY. THE 1 VISITOR MAY VISIT WITH YOU AFTER SURGERY IN YOUR PRIVATE ROOM DURING VISITING HOURS ONLY!  YOU NEED TO HAVE A COVID 19 TEST ON 09-29-19 @8 :45 AM, THIS TEST MUST BE DONE BEFORE SURGERY, COME  [address], [place] , .  [facility]) ONCE YOUR COVID TEST IS COMPLETED, PLEASE BEGIN THE QUARANTINE INSTRUCTIONS AS OUTLINED IN YOUR HANDOUT.                Robert Baker  09/22/2019   Your procedure is scheduled on: 10-01-19   Report to [facility] Main  Entrance    Report to admitting at 8:30  AM     Call this number if you have problems the morning of surgery [phone]    Remember: Do not eat food or drink liquids :After Midnight.     Take these medicines the morning of surgery with A SIP OF WATER: Fexofenadine (Allegra). You may also bring your nasal spray.    BRUSH YOUR TEETH MORNING OF SURGERY AND RINSE YOUR MOUTH OUT, NO CHEWING GUM CANDY OR MINTS.  DO NOT TAKE ANY DIABETIC MEDICATIONS DAY OF YOUR SURGERY                               You may not have any metal on your body including hair pins and              piercings     Do not wear jewelry, cologne, lotions, powders or deodorant                         Men may shave face and neck.   Do not bring valuables to the hospital. [facility] IS NOT             RESPONSIBLE   FOR VALUABLES.  Contacts, dentures or bridgework may not be worn into surgery.  You may bring a small overnight bag     Special Instructions: N/A              Please read over the following fact sheets you were given: _____________________________________________________________________             How to Manage Your Diabetes Before and After Surgery  Why is it important to control my blood sugar before and after surgery? . Improving blood sugar levels before and after  surgery helps healing and can limit problems. . A way of improving blood sugar control is eating a healthy diet by: o  Eating less sugar and carbohydrates o  Increasing activity/exercise o  Talking with your doctor about reaching your blood sugar goals . High blood sugars (greater than 180 mg/dL) can raise your risk of infections and slow your recovery, so you will need to focus on controlling your diabetes during the weeks before surgery. . Make sure that the doctor who takes care of your diabetes knows about your planned surgery including the date and location.  How do I manage my blood sugar before surgery? . Check your blood sugar at least 4 times a day, starting 2 days before surgery, to make sure that the level is not too high or low. o Check your blood sugar the morning of your surgery when you wake  up and every 2 hours until you get to the Short Stay unit. . If your blood sugar is less than 70 mg/dL, you will need to treat for low blood sugar: o Do not take insulin. o Treat a low blood sugar (less than 70 mg/dL) with  cup of clear juice (cranberry or apple), 4 glucose tablets, OR glucose gel. o Recheck blood sugar in 15 minutes after treatment (to make sure it is greater than 70 mg/dL). If your blood sugar is not greater than 70 mg/dL on recheck, call [phone] for further instructions. . Report your blood sugar to the short stay nurse when you get to Short Stay.  . If you are admitted to the hospital after surgery: o Your blood sugar will be checked by the staff and you will probably be given insulin after surgery (instead of oral diabetes medicines) to make sure you have good blood sugar levels. o The goal for blood sugar control after surgery is 80-180 mg/dL.   WHAT DO I DO ABOUT MY DIABETES MEDICATION?  Marland Kitchen Do not take oral diabetes medicines (pills) the morning of surgery.  . THE DAY BEFORE SURGERY, take your usually prescribed 3-10 units of Novolg insulin for meals.  Do not  take a bedtime dose of your Tresiba Insulin.       . The day of surgery, do not take other diabetes injectables, including Byetta (exenatide), Bydureon (exenatide ER), Victoza (liraglutide), or Trulicity (dulaglutide).  . If your CBG is greater than 220 mg/dL, you may take  of your sliding scale  . (correction) dose of insulin.      Reviewed and Endorsed by [facility] Patient Education Committee, August 2015   [facility] - Preparing for Surgery Before surgery, you can play an important role.  Because skin is not sterile, your skin needs to be as free of germs as possible.  You can reduce the number of germs on your skin by washing with CHG (chlorahexidine gluconate) soap before surgery.  CHG is an antiseptic cleaner which kills germs and bonds with the skin to continue killing germs even after washing. Please DO NOT use if you have an allergy to CHG or antibacterial soaps.  If your skin becomes reddened/irritated stop using the CHG and inform your nurse when you arrive at Short Stay. Do not shave (including legs and underarms) for at least 48 hours prior to the first CHG shower.  You may shave your face/neck. Please follow these instructions carefully:  1.  Shower with CHG Soap the night before surgery and the  morning of Surgery.  2.  If you choose to wash your hair, wash your hair first as usual with your  normal  shampoo.  3.  After you shampoo, rinse your hair and body thoroughly to remove the  shampoo.                           4.  Use CHG as you would any other liquid soap.  You can apply chg directly  to the skin and wash                       Gently with a scrungie or clean washcloth.  5.  Apply the CHG Soap to your body ONLY FROM THE NECK DOWN.   Do not use on face/ open  Wound or open sores. Avoid contact with eyes, ears mouth and genitals (private parts).                       Wash face,  Genitals (private parts) with your normal soap.             6.   Wash thoroughly, paying special attention to the area where your surgery  will be performed.  7.  Thoroughly rinse your body with warm water from the neck down.  8.  DO NOT shower/wash with your normal soap after using and rinsing off  the CHG Soap.                9.  Pat yourself dry with a clean towel.            10.  Wear clean pajamas.            11.  Place clean sheets on your bed the night of your first shower and do not  sleep with pets. Day of Surgery : Do not apply any lotions/deodorants the morning of surgery.  Please wear clean clothes to the hospital/surgery center.  FAILURE TO FOLLOW THESE INSTRUCTIONS MAY RESULT IN THE CANCELLATION OF YOUR SURGERY PATIENT SIGNATURE_________________________________  NURSE SIGNATURE__________________________________  ________________________________________________________________________

## 2019-09-23 ENCOUNTER — Encounter (HOSPITAL_COMMUNITY)
Admission: RE | Admit: 2019-09-23 | Discharge: 2019-09-23 | Disposition: A | Discharge status: Home / Self Care | Payer: Medicare Other | Source: Ambulatory Visit | Attending: Urology | Admitting: Urology

## 2019-09-23 ENCOUNTER — Other Ambulatory Visit: Payer: Self-pay

## 2019-09-23 ENCOUNTER — Encounter (HOSPITAL_COMMUNITY): Payer: Self-pay

## 2019-09-23 DIAGNOSIS — Z01818 Encounter for other preprocedural examination: Secondary | ICD-10-CM | POA: Insufficient documentation

## 2019-09-23 LAB — BASIC METABOLIC PANEL
Anion gap: 9 (ref 5–15)
BUN: 46 mg/dL — ABNORMAL HIGH (ref 8–23)
CO2: 23 mmol/L (ref 22–32)
Calcium: 9.1 mg/dL (ref 8.9–10.3)
Chloride: 107 mmol/L (ref 98–111)
Creatinine, Ser: 1.77 mg/dL — ABNORMAL HIGH (ref 0.61–1.24)
GFR calc Af Amer: 44 mL/min — ABNORMAL LOW (ref >60)
GFR calc non Af Amer: 38 mL/min — ABNORMAL LOW (ref >60)
Glucose, Bld: 240 mg/dL — ABNORMAL HIGH (ref 70–99)
Potassium: 5 mmol/L (ref 3.5–5.1)
Sodium: 139 mmol/L (ref 135–145)

## 2019-09-23 LAB — CBC
HCT: 38.1 % — ABNORMAL LOW (ref 39.0–52.0)
Hemoglobin: 12.1 g/dL — ABNORMAL LOW (ref 13.0–17.0)
MCH: 32.3 pg (ref 26.0–34.0)
MCHC: 31.8 g/dL (ref 30.0–36.0)
MCV: 101.6 fL — ABNORMAL HIGH (ref 80.0–100.0)
Platelets: 356 10*3/uL (ref 150–400)
RBC: 3.75 MIL/uL — ABNORMAL LOW (ref 4.22–5.81)
RDW: 11.8 % (ref 11.5–15.5)
WBC: 9.9 10*3/uL (ref 4.0–10.5)
nRBC: 0 % (ref 0.0–0.2)

## 2019-09-23 LAB — GLUCOSE, CAPILLARY: Glucose-Capillary: 228 mg/dL — ABNORMAL HIGH (ref 70–99)

## 2019-09-23 NOTE — Progress Notes (Signed)
BMP result routed to surgeon's office for review.

## 2019-09-24 LAB — HEMOGLOBIN A1C
Hgb A1c MFr Bld: 6.5 % — ABNORMAL HIGH (ref 4.8–5.6)
Mean Plasma Glucose: 140 mg/dL

## 2019-09-26 ENCOUNTER — Encounter: Payer: Self-pay | Admitting: Internal Medicine

## 2019-09-26 NOTE — Assessment & Plan Note (Signed)
He is very careful. We are avoiding systemic steroids to avoid decompensating his D?M.

## 2019-09-26 NOTE — Assessment & Plan Note (Signed)
Acute exacerbation of chronic mild asthmatic bronchitis Plan- Samples Trelegy 200. Avoiding systemic steroids due to his DM.

## 2019-09-29 ENCOUNTER — Other Ambulatory Visit (HOSPITAL_COMMUNITY)
Admission: RE | Admit: 2019-09-29 | Discharge: 2019-09-29 | Disposition: A | Discharge status: Home / Self Care | Payer: Medicare Other | Source: Ambulatory Visit | Attending: Urology | Admitting: Urology

## 2019-09-29 DIAGNOSIS — Z20822 Contact with and (suspected) exposure to covid-19: Secondary | ICD-10-CM | POA: Diagnosis not present

## 2019-09-29 DIAGNOSIS — Z01812 Encounter for preprocedural laboratory examination: Secondary | ICD-10-CM | POA: Diagnosis present

## 2019-09-29 LAB — SARS CORONAVIRUS 2 (TAT 6-24 HRS): SARS Coronavirus 2: NEGATIVE

## 2019-10-01 ENCOUNTER — Other Ambulatory Visit: Payer: Self-pay

## 2019-10-01 ENCOUNTER — Encounter (HOSPITAL_COMMUNITY)
Admission: RE | Disposition: A | Discharge status: Home / Self Care | Payer: Self-pay | Source: Home / Self Care | Attending: Urology

## 2019-10-01 ENCOUNTER — Observation Stay (HOSPITAL_COMMUNITY)
Admission: RE | Admit: 2019-10-01 | Discharge: 2019-10-02 | Disposition: A | Discharge status: Home / Self Care | Payer: Medicare Other | Attending: Urology | Admitting: Urology

## 2019-10-01 ENCOUNTER — Ambulatory Visit (HOSPITAL_COMMUNITY): Payer: Medicare Other | Admitting: Certified Registered Nurse Anesthetist

## 2019-10-01 ENCOUNTER — Encounter (HOSPITAL_COMMUNITY): Payer: Self-pay | Admitting: Urology

## 2019-10-01 DIAGNOSIS — N320 Bladder-neck obstruction: Secondary | ICD-10-CM | POA: Diagnosis not present

## 2019-10-01 DIAGNOSIS — N401 Enlarged prostate with lower urinary tract symptoms: Secondary | ICD-10-CM | POA: Diagnosis not present

## 2019-10-01 DIAGNOSIS — E785 Hyperlipidemia, unspecified: Secondary | ICD-10-CM | POA: Diagnosis not present

## 2019-10-01 DIAGNOSIS — J45909 Unspecified asthma, uncomplicated: Secondary | ICD-10-CM | POA: Diagnosis not present

## 2019-10-01 DIAGNOSIS — N132 Hydronephrosis with renal and ureteral calculous obstruction: Secondary | ICD-10-CM | POA: Insufficient documentation

## 2019-10-01 DIAGNOSIS — N400 Enlarged prostate without lower urinary tract symptoms: Secondary | ICD-10-CM | POA: Diagnosis not present

## 2019-10-01 DIAGNOSIS — Z79899 Other long term (current) drug therapy: Secondary | ICD-10-CM | POA: Diagnosis not present

## 2019-10-01 DIAGNOSIS — E119 Type 2 diabetes mellitus without complications: Secondary | ICD-10-CM | POA: Insufficient documentation

## 2019-10-01 HISTORY — PX: TRANSURETHRAL RESECTION OF PROSTATE: SHX73

## 2019-10-01 HISTORY — DX: Cardiac arrhythmia, unspecified: I49.9

## 2019-10-01 LAB — GLUCOSE, CAPILLARY
Glucose-Capillary: 140 mg/dL — ABNORMAL HIGH (ref 70–99)
Glucose-Capillary: 156 mg/dL — ABNORMAL HIGH (ref 70–99)
Glucose-Capillary: 128 mg/dL — ABNORMAL HIGH (ref 70–99)
Glucose-Capillary: 175 mg/dL — ABNORMAL HIGH (ref 70–99)
Glucose-Capillary: 244 mg/dL — ABNORMAL HIGH (ref 70–99)

## 2019-10-01 SURGERY — TURP (TRANSURETHRAL RESECTION OF PROSTATE)
Anesthesia: General

## 2019-10-01 MED ORDER — PROPOFOL 10 MG/ML IV BOLUS
INTRAVENOUS | Status: DC | PRN
  Administered 2019-10-01: 200 mg via INTRAVENOUS

## 2019-10-01 MED ORDER — LACTATED RINGERS IV SOLN: INTRAVENOUS | Status: DC

## 2019-10-01 MED ORDER — ACETAMINOPHEN 10 MG/ML IV SOLN: 1000.0000 mg | Freq: Once | INTRAVENOUS | Status: DC | PRN

## 2019-10-01 MED ORDER — PROMETHAZINE HCL 25 MG/ML IJ SOLN: 6.2500 mg | INTRAMUSCULAR | Status: DC | PRN

## 2019-10-01 MED ORDER — FENTANYL CITRATE (PF) 100 MCG/2ML IJ SOLN
INTRAMUSCULAR | Status: AC
  Filled 2019-10-01: qty 2

## 2019-10-01 MED ORDER — DIPHENHYDRAMINE HCL 12.5 MG/5ML PO ELIX: 12.5000 mg | ORAL_SOLUTION | Freq: Four times a day (QID) | ORAL | Status: DC | PRN

## 2019-10-01 MED ORDER — SODIUM CHLORIDE 0.9 % IV SOLN: 250.0000 mL | INTRAVENOUS | Status: DC | PRN

## 2019-10-01 MED ORDER — FLUTICASONE-UMECLIDIN-VILANT 200-62.5-25 MCG/INH IN AEPB: 1.0000 | INHALATION_SPRAY | Freq: Every day | RESPIRATORY_TRACT | Status: DC

## 2019-10-01 MED ORDER — ACETAMINOPHEN 160 MG/5ML PO SOLN: 325.0000 mg | Freq: Once | ORAL | Status: DC | PRN

## 2019-10-01 MED ORDER — UMECLIDINIUM BROMIDE 62.5 MCG/INH IN AEPB
1.0000 | INHALATION_SPRAY | Freq: Every day | RESPIRATORY_TRACT | Status: DC
  Filled 2019-10-01: qty 7

## 2019-10-01 MED ORDER — FENTANYL CITRATE (PF) 100 MCG/2ML IJ SOLN
INTRAMUSCULAR | Status: DC | PRN
  Administered 2019-10-01 (×4): 25 ug via INTRAVENOUS

## 2019-10-01 MED ORDER — ONDANSETRON HCL 4 MG/2ML IJ SOLN
INTRAMUSCULAR | Status: DC | PRN
  Administered 2019-10-01: 4 mg via INTRAVENOUS

## 2019-10-01 MED ORDER — LORATADINE 10 MG PO TABS
10.0000 mg | ORAL_TABLET | Freq: Every day | ORAL | Status: DC
  Administered 2019-10-02: 10 mg via ORAL
  Filled 2019-10-01: qty 1

## 2019-10-01 MED ORDER — TRAMADOL HCL 50 MG PO TABS: 50.0000 mg | ORAL_TABLET | Freq: Four times a day (QID) | ORAL | 0 refills | Status: DC | PRN

## 2019-10-01 MED ORDER — LIDOCAINE 2% (20 MG/ML) 5 ML SYRINGE
INTRAMUSCULAR | Status: DC | PRN
  Administered 2019-10-01: 40 mg via INTRAVENOUS

## 2019-10-01 MED ORDER — LIDOCAINE 2% (20 MG/ML) 5 ML SYRINGE
INTRAMUSCULAR | Status: AC
  Filled 2019-10-01: qty 5

## 2019-10-01 MED ORDER — MIDAZOLAM HCL 2 MG/2ML IJ SOLN
INTRAMUSCULAR | Status: AC
  Filled 2019-10-01: qty 2

## 2019-10-01 MED ORDER — OXYCODONE HCL 5 MG PO TABS: 5.0000 mg | ORAL_TABLET | Freq: Once | ORAL | Status: DC | PRN

## 2019-10-01 MED ORDER — MEPERIDINE HCL 50 MG/ML IJ SOLN: 6.2500 mg | INTRAMUSCULAR | Status: DC | PRN

## 2019-10-01 MED ORDER — CHLORHEXIDINE GLUCONATE 0.12 % MT SOLN
15.0000 mL | Freq: Once | OROMUCOSAL | Status: AC
  Administered 2019-10-01: 15 mL via OROMUCOSAL

## 2019-10-01 MED ORDER — AZELASTINE HCL 0.1 % NA SOLN
2.0000 | Freq: Two times a day (BID) | NASAL | Status: DC
  Administered 2019-10-02 (×2): 2 via NASAL
  Filled 2019-10-01: qty 30

## 2019-10-01 MED ORDER — FLUTICASONE-UMECLIDIN-VILANT 100-62.5-25 MCG/INH IN AEPB: 1.0000 | INHALATION_SPRAY | Freq: Every day | RESPIRATORY_TRACT | Status: DC

## 2019-10-01 MED ORDER — 0.9 % SODIUM CHLORIDE (POUR BTL) OPTIME
TOPICAL | Status: DC | PRN
  Administered 2019-10-01: 1000 mL

## 2019-10-01 MED ORDER — HYDROMORPHONE HCL 1 MG/ML IJ SOLN: 0.2500 mg | INTRAMUSCULAR | Status: DC | PRN

## 2019-10-01 MED ORDER — TRAMADOL HCL 50 MG PO TABS
50.0000 mg | ORAL_TABLET | Freq: Four times a day (QID) | ORAL | Status: DC | PRN
  Administered 2019-10-01: 100 mg via ORAL
  Administered 2019-10-01: 50 mg via ORAL
  Filled 2019-10-01: qty 1
  Filled 2019-10-01: qty 2

## 2019-10-01 MED ORDER — OXYCODONE HCL 5 MG/5ML PO SOLN: 5.0000 mg | Freq: Once | ORAL | Status: DC | PRN

## 2019-10-01 MED ORDER — SODIUM CHLORIDE 0.9 % IR SOLN
3000.0000 mL | Status: DC
  Administered 2019-10-01: 3000 mL

## 2019-10-01 MED ORDER — ALBUTEROL SULFATE HFA 108 (90 BASE) MCG/ACT IN AERS: 1.0000 | INHALATION_SPRAY | RESPIRATORY_TRACT | Status: DC | PRN

## 2019-10-01 MED ORDER — ACETAMINOPHEN 325 MG PO TABS
650.0000 mg | ORAL_TABLET | ORAL | Status: DC | PRN
  Administered 2019-10-01: 650 mg via ORAL
  Filled 2019-10-01: qty 2

## 2019-10-01 MED ORDER — OXYBUTYNIN CHLORIDE 5 MG PO TABS
5.0000 mg | ORAL_TABLET | Freq: Once | ORAL | Status: AC
  Administered 2019-10-01: 5 mg via ORAL
  Filled 2019-10-01: qty 1

## 2019-10-01 MED ORDER — DIPHENHYDRAMINE HCL 50 MG/ML IJ SOLN: 12.5000 mg | Freq: Four times a day (QID) | INTRAMUSCULAR | Status: DC | PRN

## 2019-10-01 MED ORDER — BELLADONNA ALKALOIDS-OPIUM 16.2-60 MG RE SUPP
1.0000 | Freq: Four times a day (QID) | RECTAL | Status: DC | PRN
  Administered 2019-10-01: 1 via RECTAL
  Filled 2019-10-01: qty 1

## 2019-10-01 MED ORDER — ALBUTEROL SULFATE (2.5 MG/3ML) 0.083% IN NEBU: 2.5000 mg | INHALATION_SOLUTION | RESPIRATORY_TRACT | Status: DC | PRN

## 2019-10-01 MED ORDER — ORAL CARE MOUTH RINSE: 15.0000 mL | Freq: Once | OROMUCOSAL | Status: AC

## 2019-10-01 MED ORDER — ZOLPIDEM TARTRATE 5 MG PO TABS: 5.0000 mg | ORAL_TABLET | Freq: Every evening | ORAL | Status: DC | PRN

## 2019-10-01 MED ORDER — ONDANSETRON HCL 4 MG/2ML IJ SOLN
INTRAMUSCULAR | Status: AC
  Filled 2019-10-01: qty 2

## 2019-10-01 MED ORDER — SODIUM CHLORIDE 0.9% FLUSH: 3.0000 mL | INTRAVENOUS | Status: DC | PRN

## 2019-10-01 MED ORDER — GLYCOPYRROLATE PF 0.2 MG/ML IJ SOSY
PREFILLED_SYRINGE | INTRAMUSCULAR | Status: DC | PRN
  Administered 2019-10-01 (×2): .1 mg via INTRAVENOUS

## 2019-10-01 MED ORDER — SODIUM CHLORIDE 0.9 % IV SOLN
2.0000 g | Freq: Once | INTRAVENOUS | Status: AC
  Administered 2019-10-01: 2 g via INTRAVENOUS
  Filled 2019-10-01: qty 20

## 2019-10-01 MED ORDER — SODIUM CHLORIDE 0.9 % IR SOLN
Status: DC | PRN
  Administered 2019-10-01: 24000 mL via INTRAVESICAL

## 2019-10-01 MED ORDER — INSULIN ASPART 100 UNIT/ML ~~LOC~~ SOLN
0.0000 [IU] | SUBCUTANEOUS | Status: DC
  Administered 2019-10-01: 3 [IU] via SUBCUTANEOUS
  Administered 2019-10-01: 5 [IU] via SUBCUTANEOUS
  Administered 2019-10-02 (×2): 2 [IU] via SUBCUTANEOUS

## 2019-10-01 MED ORDER — FLUTICASONE PROPIONATE 50 MCG/ACT NA SUSP
2.0000 | Freq: Every day | NASAL | Status: DC | PRN
  Filled 2019-10-01: qty 16

## 2019-10-01 MED ORDER — EPHEDRINE SULFATE-NACL 50-0.9 MG/10ML-% IV SOSY
PREFILLED_SYRINGE | INTRAVENOUS | Status: DC | PRN
  Administered 2019-10-01 (×4): 5 mg via INTRAVENOUS

## 2019-10-01 MED ORDER — SODIUM CHLORIDE 0.9% FLUSH: 3.0000 mL | Freq: Two times a day (BID) | INTRAVENOUS | Status: DC

## 2019-10-01 MED ORDER — ACETAMINOPHEN 325 MG PO TABS: 325.0000 mg | ORAL_TABLET | Freq: Once | ORAL | Status: DC | PRN

## 2019-10-01 MED ORDER — ONDANSETRON HCL 4 MG/2ML IJ SOLN
4.0000 mg | INTRAMUSCULAR | Status: DC | PRN
  Administered 2019-10-02: 4 mg via INTRAVENOUS
  Filled 2019-10-01: qty 2

## 2019-10-01 MED ORDER — INDIGOTINDISULFONATE SODIUM 8 MG/ML IJ SOLN
INTRAMUSCULAR | Status: DC | PRN
Start: 2019-10-01 — End: 2019-10-01
  Administered 2019-10-01: 5 mL via INTRAVENOUS

## 2019-10-01 MED ORDER — GLYCOPYRROLATE PF 0.2 MG/ML IJ SOSY
PREFILLED_SYRINGE | INTRAMUSCULAR | Status: AC
  Filled 2019-10-01: qty 1

## 2019-10-01 MED ORDER — SODIUM CHLORIDE 0.45 % IV SOLN: INTRAVENOUS | Status: DC

## 2019-10-01 MED ORDER — PROPOFOL 10 MG/ML IV BOLUS
INTRAVENOUS | Status: AC
  Filled 2019-10-01: qty 20

## 2019-10-01 MED ORDER — INDIGOTINDISULFONATE SODIUM 8 MG/ML IJ SOLN
INTRAMUSCULAR | Status: AC
  Filled 2019-10-01: qty 5

## 2019-10-01 MED ORDER — DOCUSATE SODIUM 100 MG PO CAPS
100.0000 mg | ORAL_CAPSULE | Freq: Two times a day (BID) | ORAL | Status: DC
  Administered 2019-10-01 – 2019-10-02 (×2): 100 mg via ORAL
  Filled 2019-10-01 (×3): qty 1

## 2019-10-01 MED ORDER — FLUTICASONE FUROATE-VILANTEROL 100-25 MCG/INH IN AEPB
1.0000 | INHALATION_SPRAY | Freq: Every day | RESPIRATORY_TRACT | Status: DC
  Filled 2019-10-01: qty 28

## 2019-10-01 MED ORDER — PRAVASTATIN SODIUM 20 MG PO TABS
20.0000 mg | ORAL_TABLET | Freq: Every day | ORAL | Status: DC
  Administered 2019-10-02: 20 mg via ORAL
  Filled 2019-10-01: qty 1

## 2019-10-01 SURGICAL SUPPLY — 18 items
BAG DRN RND TRDRP ANRFLXCHMBR (UROLOGICAL SUPPLIES) ×1
BAG URINE DRAIN 2000ML AR STRL (UROLOGICAL SUPPLIES) ×3 IMPLANT
BAG URO CATCHER STRL LF (MISCELLANEOUS) ×3 IMPLANT
CATH HEMA 3WAY 30CC 22FR COUDE (CATHETERS) IMPLANT
GLOVE BIOGEL M STRL SZ7.5 (GLOVE) ×3 IMPLANT
GOWN STRL REUS W/TWL LRG LVL3 (GOWN DISPOSABLE) ×3 IMPLANT
HOLDER FOLEY CATH W/STRAP (MISCELLANEOUS) IMPLANT
KIT TURNOVER KIT A (KITS) IMPLANT
LOOP CUT BIPOLAR 24F LRG (ELECTROSURGICAL) ×2 IMPLANT
MANIFOLD NEPTUNE II (INSTRUMENTS) ×3 IMPLANT
PACK CYSTO (CUSTOM PROCEDURE TRAY) ×3 IMPLANT
PENCIL SMOKE EVACUATOR (MISCELLANEOUS) IMPLANT
SYR 30ML LL (SYRINGE) ×3 IMPLANT
SYR TOOMEY IRRIG 70ML (MISCELLANEOUS) ×3
SYRINGE TOOMEY IRRIG 70ML (MISCELLANEOUS) ×1 IMPLANT
TUBING CONNECTING 10 (TUBING) ×2 IMPLANT
TUBING CONNECTING 10' (TUBING) ×1
TUBING UROLOGY SET (TUBING) ×3 IMPLANT

## 2019-10-01 NOTE — Progress Notes (Signed)
Patient ID: Robert Baker, male   DOB: 20-Dec-1950, 69 y.o.   MRN: [id number]  .Marland KitchenPost-op note  Subjective: The patient is doing well.  No complaints except bladder pressure.  He received B&O suppository and tramadol.  Objective: Vital signs in last 24 hours: Temp:  [97.4 F (36.3 C)-97.9 F (36.6 C)] 97.4 F (36.3 C) (07/08 1256) Pulse Rate:  [43-98] 46 (07/08 1256) Resp:  [13-18] 15 (07/08 1256) BP: (117-176)/(56-86) 170/56 (07/08 1256) SpO2:  [97 %-100 %] 100 % (07/08 1256) Weight:  [80.7 kg] 80.7 kg (07/08 0843)  Intake/Output from previous day: No intake/output data recorded. Intake/Output this shift: Total I/O In: 1400 [I.V.:700; Other:600; IV Piggyback:100] Out: 2100 [Urine:2100]  Physical Exam:  General: Alert and oriented. Abdomen: Soft, Nondistended. Incisions: Clean and dry. GU: Urine clear on minimal drip.  Lab Results:  Assessment/Plan: POD#0   1) Continue to monitor, oxybutynin for bladder spasms.  Titrate CBI off overnight.   Rolly Salter, Montez Hageman. MD   LOS: 0 days   Crecencio Mc 10/01/2019, 2:53 PM

## 2019-10-01 NOTE — Interval H&P Note (Signed)
History and Physical Interval Note:  10/01/2019 9:01 AM  Robert Baker  has presented today for surgery, with the diagnosis of BENIGN PROSTATIC  HYPERPLASIA.  The various methods of treatment have been discussed with the patient and family. After consideration of risks, benefits and other options for treatment, the patient has consented to  Procedure(s): TRANSURETHRAL RESECTION OF THE PROSTATE (TURP) WITH CYSTOSCOPY (N/A) as a surgical intervention.  The patient's history has been reviewed, patient examined, no change in status, stable for surgery.  I have reviewed the patient's chart and labs.  Questions were answered to the patient's satisfaction.     [employer]

## 2019-10-01 NOTE — Progress Notes (Signed)
Received pt from PACU, pt c/o pressure and bladder spasms, pt anixous and want to sit in the BR, assist pt to BR prior to medication. Will cont to monitor. SRP, RN

## 2019-10-01 NOTE — Progress Notes (Signed)
Pt states mild relief of pain, medicated with Tylendol and Tramadol 50 mg. Again pt want to sit in BR. Up to BR will continue to monitor. SRP, RN

## 2019-10-01 NOTE — Op Note (Signed)
Preoperative diagnosis: 1. Bladder outlet obstruction secondary to BPH  Postoperative diagnosis:  1. Bladder outlet obstruction secondary to BPH  Procedure:  1. Cystoscopy 2. Transurethral resection of the prostate  Surgeon: Moody Bruins. M.D.  Anesthesia: General  Complications: None  EBL: Minimal  Specimens: 1. Prostate chips  Disposition of specimens: Pathology  Indication: HAWK MONES is a patient with bladder outlet obstruction secondary to benign prostatic hyperplasia. After reviewing the management options for treatment, he elected to proceed with the above surgical procedure(s). We have discussed the potential benefits and risks of the procedure, side effects of the proposed treatment, the likelihood of the patient achieving the goals of the procedure, and any potential problems that might occur during the procedure or recuperation. Informed consent has been obtained.  Description of procedure:  The patient was taken to the operating room and general anesthesia was induced.  The patient was placed in the dorsal lithotomy position, prepped and draped in the usual sterile fashion, and preoperative antibiotics were administered. A preoperative time-out was performed.   Cystourethroscopy was performed.  The patient's urethra was examined and demonstrated bilobar prostatic hypertrophy with a large obstructing median lobe.  Prostatic urethral length was about 2.5 cm.   The bladder was then systematically examined in its entirety. There was no evidence of any bladder tumors, stones, or other mucosal pathology except for severe trabeculation.  The ureteral orifices were identified and marked so as to be avoided during the procedure.  The right orifice was quite dilated and refluxed easily.  The left orifice appeared normal.  The prostate adenoma was then resected utilizing loop cautery resection with the bipolar cutting loop.  The prostate adenoma from the bladder neck  back to the verumontanum was resected beginning at the six o'clock position and then extended to include the right and left lobes of the prostate and anterior prostate. Care was taken not to resect distal to the verumontanum.  The main obstructing tissue was the large intravesical median lobe.  Once this was resected, additional resection of the lateral lobes was undertaken.  Hemostasis was then achieved with the cautery and the bladder was emptied and reinspected with no significant bleeding noted at the end of the procedure.    A 3 way catheter was then placed into the bladder and placed on continuous bladder irrigation.  The patient appeared to tolerate the procedure well and without complications.  The patient was able to be awakened and transferred to the recovery unit in satisfactory condition.

## 2019-10-01 NOTE — Discharge Instructions (Signed)
1. You may see some blood in the urine and may have some burning with urination for 48-72 hours. You also may notice that you have to urinate more frequently or urgently after your procedure which is normal.  °2. You should call should you develop an inability urinate, fever > 101, persistent nausea and vomiting that prevents you from eating or drinking to stay hydrated.  °

## 2019-10-01 NOTE — Transfer of Care (Signed)
Immediate Anesthesia Transfer of Care Note  Patient: MAHONRI SEIDEN  Procedure(s) Performed: TRANSURETHRAL RESECTION OF THE PROSTATE (TURP) WITH CYSTOSCOPY (N/A )  Patient Location: PACU  Anesthesia Type:General  Level of Consciousness: awake, alert  and oriented  Airway & Oxygen Therapy: Patient Spontanous Breathing and Patient connected to face mask oxygen  Post-op Assessment: Report given to RN and Post -op Vital signs reviewed and stable  Post vital signs: Reviewed and stable  Last Vitals:  Vitals Value Taken Time  BP    Temp    Pulse 57 10/01/19 1051  Resp 14 10/01/19 1051  SpO2 100 % 10/01/19 1051  Vitals shown include unvalidated device data.  Last Pain:  Vitals:   10/01/19 0843  TempSrc:   PainSc: 0-No pain         Complications: No complications documented.

## 2019-10-01 NOTE — Anesthesia Procedure Notes (Signed)
Procedure Name: LMA Insertion Date/Time: 10/01/2019 9:23 AM Performed by: Orest Dikes, CRNA Pre-anesthesia Checklist: Patient identified, Emergency Drugs available, Suction available and Patient being monitored Patient Re-evaluated:Patient Re-evaluated prior to induction Oxygen Delivery Method: Circle system utilized Preoxygenation: Pre-oxygenation with 100% oxygen Induction Type: IV induction LMA: LMA inserted LMA Size: 5.0 Number of attempts: 1 Placement Confirmation: positive ETCO2 and breath sounds checked- equal and bilateral Tube secured with: Tape Dental Injury: Teeth and Oropharynx as per pre-operative assessment  Comments: LMA #4 gently placed. Leak remains despite repositioning, so LMA removed and LMA#5 gently placed.

## 2019-10-01 NOTE — H&P (Signed)
Office Visit Report     09/02/2019   --------------------------------------------------------------------------------   Robert Baker  MRN: [id number]  DOB: [birth year], 69 year old Male  SSN:    PRIMARY CARE:  C Duane Lope, MD  REFERRING:  Heloise Purpura, Lowella Bandy  PROVIDER:  Heloise Purpura, M.D.  LOCATION:  [facility] [phone]     --------------------------------------------------------------------------------   CC/HPI: 1. BPH/LUTS  2. Hydronephrosis with chronic kidney disease   He returns today to undergo cystoscopy and to discuss his recent urodynamic findings. He denies any changes in his symptoms and continues to void without significant difficulty. He denies any hematuria or flank pain.     ALLERGIES: No Allergies    MEDICATIONS: Tamsulosin Hcl 0.4 mg capsule 1 capsule PO Q HS  Albuterol Sulfate  Azelastine Hcl  Novolog  Pravastatin Sodium  Trelegy Ellipta  Tresiba     GU PSH: Complex cystometrogram, w/ void pressure and urethral pressure profile studies, any technique - 09/01/2019 Complex Uroflow - 09/01/2019 Emg surf Electrd - 09/01/2019 Inject For cystogram - 09/01/2019 Intrabd voidng Press - 09/01/2019       PSH Notes: Bilateral knee surgery- 1990's   Right Wrist surgery- 1980   NON-GU PSH: Rotator Cuff Surgery.., Right     GU PMH: BPH w/LUTS - 08/05/2019 Incomplete bladder emptying - 08/05/2019 Nocturia - 08/05/2019      PMH Notes:   1) BPH/LUTS: He presented in May 2021 with symptoms of nocturia and nocturnal incontinence. He was noted to have a PVR of > 1000 cc. He has a reported history of a urologic evaluation at [facility] around age 83 and was apparently told that he had a poorly functional, hydronephrotic kidney likely related to reflux. Baseline IPSS is 22.   NON-GU PMH: Arthritis Asthma Diabetes Type 2 GERD Hypercholesterolemia    FAMILY HISTORY: Cancer - Runs in Family Heart Disease - Runs in Family Kidney Stones -  Father, Brother Parkinson's Disease - Brother   SOCIAL HISTORY: Marital Status: Married Preferred Language: English; Ethnicity: Not Hispanic Or Latino; Race: White Current Smoking Status: Patient has never smoked.   Tobacco Use Assessment Completed: Used Tobacco in last 30 days? Drinks 1 drink per day. Social Drinker.  Drinks 2 caffeinated drinks per day.    REVIEW OF SYSTEMS:    GU Review Male:   Patient reports get up at night to urinate, stream starts and stops, leakage of urine, frequent urination, and burning/ pain with urination. Patient denies have to strain to urinate , trouble starting your streams, and hard to postpone urination.  Gastrointestinal (Lower):   Patient denies diarrhea and constipation.  Gastrointestinal (Upper):   Patient denies nausea and vomiting.  Constitutional:   Patient reports fatigue. Patient denies fever, night sweats, and weight loss.  Skin:   Patient denies skin rash/ lesion and itching.  Eyes:   Patient denies blurred vision and double vision.  Ears/ Nose/ Throat:   Patient denies sore throat and sinus problems.  Hematologic/Lymphatic:   Patient denies swollen glands and easy bruising.  Cardiovascular:   Patient denies leg swelling and chest pains.  Respiratory:   Patient denies cough and shortness of breath.  Endocrine:   Patient denies excessive thirst.  Musculoskeletal:   Patient denies back pain and joint pain.  Neurological:   Patient denies headaches and dizziness.  Psychologic:   Patient denies depression and anxiety.   VITAL SIGNS:      09/02/2019 11:50 AM  Weight 175 lb /  79.38 kg  Height 71 in / 180.34 cm  BP 155/88 mmHg  Pulse 48 /min  Temperature 97.8 F / 36.5 C  BMI 24.4 kg/m   GU PHYSICAL EXAMINATION:    Urethral Meatus: Normal size. No lesion, no wart, no discharge, no polyp. Normal location.   MULTI-SYSTEM PHYSICAL EXAMINATION:    Constitutional: Well-nourished. No physical deformities. Normally developed. Good grooming.   Respiratory: No labored breathing, no use of accessory muscles. Clear bilaterally.  Cardiovascular: Normal temperature, normal extremity pulses, no swelling, no varicosities. Regular rate and rhythm.     Complexity of Data:  Lab Test Review:   PSA  Urine Test Review:   Urinalysis  Urodynamics Review:   Review Urodynamics Tests  Notes:                     PSA from 06/29/2019 was 1.73   We reviewed his urodynamic study. On his filling phase, he does have delayed sensation with a bladder capacity of over 1600 cc. His 1st sensation is at 1200 cc. He does have a stable bladder and compliant bladder. On voiding phase, he does have preserved detrusor function with a maximal detrusor pressure of over 50 cm of water but with a poor urinary flow of 5 cc/second consistent with bladder outlet obstruction.   PROCEDURES:         Flexible Cystoscopy - 52000  Indication: BPH and LUTS Risks, benefits, and potential complications of the procedure were discussed with the patient including infection, bleeding, voiding discomfort, urinary retention, fever, chills, sepsis, and others. All questions were answered. Informed consent was obtained. Sterile technique and intraurethral analgesia were used.  Meatus:  Normal size. Normal location. Normal condition.  Urethra:  No strictures.  External Sphincter:  Normal.  Verumontanum:  Normal.  Prostate:  He has an enlarged obstructing median lobe with an intravesical component. The prostate urethral length is approximately 2.5 cm.   Bladder Neck:  Non-obstructing.  Ureteral Orifices:  Normal location. Normal size. Normal shape. Effluxed clear urine.  Bladder:  Severe trabeculation throughout the bladder. No bladder tumors, stones, or other mucosal pathology identified.      Chaperone: Michelle Nasuti The procedure was well-tolerated and without complications. Instructions were given to call the office immediately if questions or problems.         Urinalysis Dipstick  Dipstick Cont'd  Color: Yellow Bilirubin: Neg mg/dL  Appearance: Clear Ketones: Neg mg/dL  Specific Gravity: 4.401 Blood: Neg ery/uL  pH: 5.5 Protein: Neg mg/dL  Glucose: Neg mg/dL Urobilinogen: 0.2 mg/dL    Nitrites: Neg    Leukocyte Esterase: Neg leu/uL    ASSESSMENT:      ICD-10 Details  1 GU:   BPH w/LUTS - N40.1   2   Incomplete bladder emptying - R39.14    PLAN:           Schedule Return Visit/Planned Activity: Other See Visit Notes             Note: Will call to schedule surgery.          Document Letter(s):  Created for Patient: Clinical Summary         Notes:   1. BPH/LUTS/incomplete emptying: We reviewed his urodynamic study today that demonstrates preserved detrusor function with poor urinary flow consistent with bladder outlet obstruction. He does have a very high bladder volume we decreased sensation consistent with chronic obstruction. We discussed his cystoscopic results that do reveal a clear obstructing median lobe is the  most likely source for his symptoms. He understands that continued medical therapy is not likely to be extremely beneficial in this setting of anatomical obstruction. As such, we have discussed options and I have recommended consideration of a transurethral resection of the prostate. We have reviewed the potential risks, complications, and expected recovery process associated with this procedure. He gives informed consent to proceed and would like to schedule this when he returns from his vacation over the next 2 weeks.   Cc: Dr. Duane Lope    * Signed by Heloise Purpura, M.D. on 09/02/19 at 7:43 PM (EDT)*

## 2019-10-01 NOTE — Progress Notes (Signed)
Pt cont to c/o pain and pressure. Reassured pt, paged MD. Will continue to monitor. Medicated earlier states pain level 7/10 unchanged.Joellyn Quails, RN

## 2019-10-01 NOTE — Anesthesia Postprocedure Evaluation (Signed)
Anesthesia Post Note  Patient: Robert Baker  Procedure(s) Performed: TRANSURETHRAL RESECTION OF THE PROSTATE (TURP) WITH CYSTOSCOPY (N/A )     Patient location during evaluation: PACU Anesthesia Type: General Level of consciousness: awake and alert Pain management: pain level controlled Vital Signs Assessment: post-procedure vital signs reviewed and stable Respiratory status: spontaneous breathing, nonlabored ventilation, respiratory function stable and patient connected to nasal cannula oxygen Cardiovascular status: blood pressure returned to baseline and stable Postop Assessment: no apparent nausea or vomiting Anesthetic complications: no   No complications documented.  Last Vitals:  Vitals:   10/01/19 1230 10/01/19 1256  BP: (!) 170/68 (!) 170/56  Pulse: 62 (!) 46  Resp: 15 15  Temp: (!) 36.4 C (!) 36.3 C  SpO2: 100% 100%    Last Pain:  Vitals:   10/01/19 1256  TempSrc: Oral  PainSc:                  Shelton Silvas

## 2019-10-01 NOTE — Anesthesia Preprocedure Evaluation (Signed)
Anesthesia Evaluation  Patient identified by MRN, date of birth, ID band Patient awake    Reviewed: Allergy & Precautions, NPO status , Patient's Chart, lab work & pertinent test results  Airway Mallampati: I  TM Distance: >3 FB Neck ROM: Full    Dental  (+) Teeth Intact, Dental Advisory Given   Pulmonary asthma ,    breath sounds clear to auscultation       Cardiovascular negative cardio ROS   Rhythm:Regular Rate:Normal     Neuro/Psych negative neurological ROS  negative psych ROS   GI/Hepatic negative GI ROS, Neg liver ROS,   Endo/Other  diabetes, Type 1, Insulin Dependent  Renal/GU Renal Insufficiency and CRFRenal disease     Musculoskeletal  (+) Arthritis , Osteoarthritis,    Abdominal Normal abdominal exam  (+)   Peds  Hematology negative hematology ROS (+)   Anesthesia Other Findings   Reproductive/Obstetrics                             Anesthesia Physical Anesthesia Plan  ASA: III  Anesthesia Plan: General   Post-op Pain Management:    Induction: Intravenous  PONV Risk Score and Plan: 3 and Ondansetron, Dexamethasone, Midazolam and Treatment may vary due to age or medical condition  Airway Management Planned: LMA  Additional Equipment: None  Intra-op Plan:   Post-operative Plan: Extubation in OR  Informed Consent: I have reviewed the patients History and Physical, chart, labs and discussed the procedure including the risks, benefits and alternatives for the proposed anesthesia with the patient or authorized representative who has indicated his/her understanding and acceptance.     Dental advisory given  Plan Discussed with: CRNA  Anesthesia Plan Comments:         Anesthesia Quick Evaluation

## 2019-10-02 ENCOUNTER — Encounter (HOSPITAL_COMMUNITY): Payer: Self-pay | Admitting: Urology

## 2019-10-02 DIAGNOSIS — E785 Hyperlipidemia, unspecified: Secondary | ICD-10-CM | POA: Diagnosis not present

## 2019-10-02 DIAGNOSIS — Z79899 Other long term (current) drug therapy: Secondary | ICD-10-CM | POA: Diagnosis not present

## 2019-10-02 DIAGNOSIS — J45909 Unspecified asthma, uncomplicated: Secondary | ICD-10-CM | POA: Diagnosis not present

## 2019-10-02 DIAGNOSIS — N132 Hydronephrosis with renal and ureteral calculous obstruction: Secondary | ICD-10-CM | POA: Diagnosis not present

## 2019-10-02 DIAGNOSIS — N401 Enlarged prostate with lower urinary tract symptoms: Secondary | ICD-10-CM | POA: Diagnosis not present

## 2019-10-02 DIAGNOSIS — E119 Type 2 diabetes mellitus without complications: Secondary | ICD-10-CM | POA: Diagnosis not present

## 2019-10-02 LAB — GLUCOSE, CAPILLARY
Glucose-Capillary: 104 mg/dL — ABNORMAL HIGH (ref 70–99)
Glucose-Capillary: 121 mg/dL — ABNORMAL HIGH (ref 70–99)
Glucose-Capillary: 128 mg/dL — ABNORMAL HIGH (ref 70–99)
Glucose-Capillary: 273 mg/dL — ABNORMAL HIGH (ref 70–99)
Glucose-Capillary: 79 mg/dL (ref 70–99)

## 2019-10-02 LAB — SURGICAL PATHOLOGY

## 2019-10-02 NOTE — Progress Notes (Signed)
Dr. Laverle Patter on the floor and given update. Pt has not voided post foley catheter removal early AM. Blader scan done with 243 results

## 2019-10-02 NOTE — Progress Notes (Signed)
Pt to be discharged to home this evening. Pt and Pt's Wife given discharge teaching including all Medications and schedules reviewed with Pt and Pt's Wife. Understanding verbalized. Discharge packet with Pt at time of discharge

## 2019-10-02 NOTE — Progress Notes (Signed)
Patient ID: Robert Baker, male   DOB: [birth year], 69 y.o.   MRN: [id number]  1 Day Post-Op Subjective: Doing well.  Bladders spasms resolved.  Objective: Vital signs in last 24 hours: Temp:  [97.4 F (36.3 C)-98.5 F (36.9 C)] 98.5 F (36.9 C) (07/09 0500) Pulse Rate:  [40-98] 51 (07/09 0500) Resp:  [13-18] 17 (07/09 0500) BP: (110-176)/(50-86) 110/69 (07/09 0500) SpO2:  [95 %-100 %] 100 % (07/09 0500) Weight:  [80.7 kg] 80.7 kg (07/08 0843)  Intake/Output from previous day: 07/08 0701 - 07/09 0700 In: 3300 [P.O.:400; I.V.:700; IV Piggyback:100] Out: 7300 [Urine:7300] Intake/Output this shift: No intake/output data recorded.  Physical Exam:  General: Alert and oriented Abdomen: Soft, ND GU: Urine clear Ext: NT, No erythema  Lab Results:   Studies/Results: No results found.  Assessment/Plan: Voiding trial with discharge later   LOS: 0 days   Crecencio Mc 10/02/2019, 7:06 AM

## 2019-10-02 NOTE — Discharge Summary (Signed)
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 Briefly, Robert Baker is a 69 y.o. year old patient with BOO due to BPH.   Hospital Course: He underwent TURP on 10/01/19.  He was maintained on CBI overnight and his urine was clear off CBI on POD # 1.  He catheter was removed. He was not able to void that morning.  Bladder scan was performed and was < 300 cc.  With more time, he was able to begin voiding and his urine was clearing. His PVR was still about 300 cc.  We discussed options of catheter replacement or discharge and he chose the latter approach.  We agreed to have him continue tamsulosin for now.  Disposition: Home  Discharge instruction: The patient was instructed to be ambulatory but told to refrain from heavy lifting, strenuous activity, or driving. He was instructed to return to the ER if he develops urinary retention.  Discharge medications:  Allergies as of 10/02/2019      Reactions   Chocolate Nausea Only, Other (See Comments)   GI UPSET      Medication List    STOP taking these medications   tamsulosin 0.4 MG Caps capsule Commonly known as: FLOMAX     TAKE these medications   albuterol 108 (90 Base) MCG/ACT inhaler Commonly known as: ProAir HFA 2 puffs every 4-6 hours as needed What changed:   how much to take  how to take this  when to take this  reasons to take this   azelastine 0.1 % nasal spray Commonly known as: ASTELIN Place 2 sprays into both nostrils 2 (two) times daily. Use in each nostril as directed   BD Insulin Syringe U/F 31G X 5/16" 0.3 ML Misc Generic drug: Insulin Syringe-Needle U-100   Co Q-10 100 MG Caps Take 300 mg by mouth daily.   diclofenac sodium 1 %  Gel Commonly known as: VOLTAREN Apply 1 application topically 4 (four) times daily as needed (pain). apply 2-4 grams three times a day to four times a day to affected area OF FOOT as needed   fexofenadine 180 MG tablet Commonly known as: ALLEGRA Take 180 mg by mouth daily.   fluticasone 50 MCG/ACT nasal spray Commonly known as: Flonase Allergy Relief Place 2 sprays into both nostrils daily. What changed:   when to take this  reasons to take this   insulin aspart 100 UNIT/ML injection Commonly known as: novoLOG Inject 3-10 Units into the skin 3 (three) times daily with meals. Inject units based on blood sugar readings throughout the day (avg of 25 units a day)   multivitamin tablet Take 1 tablet by mouth daily.   OneTouch Verio test strip Generic drug: glucose blood   POTASSIMIN PO Take 1 tablet by mouth daily.   pravastatin 20 MG tablet Commonly known as: PRAVACHOL Take 20 mg by mouth daily.   traMADol 50 MG tablet Commonly known as: ULTRAM Take 1-2 tablets (50-100 mg total) by mouth every 6 (six) hours as needed (pain).   Trelegy Ellipta 100-62.5-25 MCG/INH Aepb Generic drug: Fluticasone-Umeclidin-Vilant Inhale 1 puff into the lungs daily.   Trelegy Ellipta 200-62.5-25 MCG/INH Aepb Generic drug: Fluticasone-Umeclidin-Vilant Inhale 1 puff into the lungs daily.   Tresiba 100 UNIT/ML Soln Generic drug: Insulin Degludec Inject  8 Units into the muscle at bedtime.       Followup:   Follow-up Information    Heloise Purpura, MD.   Specialty: Urology Why: 10/27/19 at 10:45 AM Contact information: [address] [place] [address] [phone]

## 2019-12-16 DIAGNOSIS — N401 Enlarged prostate with lower urinary tract symptoms: Secondary | ICD-10-CM | POA: Diagnosis not present

## 2019-12-16 DIAGNOSIS — R3914 Feeling of incomplete bladder emptying: Secondary | ICD-10-CM | POA: Diagnosis not present

## 2019-12-16 DIAGNOSIS — R8271 Bacteriuria: Secondary | ICD-10-CM | POA: Diagnosis not present

## 2019-12-18 DIAGNOSIS — E1122 Type 2 diabetes mellitus with diabetic chronic kidney disease: Secondary | ICD-10-CM | POA: Diagnosis not present

## 2019-12-18 DIAGNOSIS — N189 Chronic kidney disease, unspecified: Secondary | ICD-10-CM | POA: Diagnosis not present

## 2019-12-18 DIAGNOSIS — J45909 Unspecified asthma, uncomplicated: Secondary | ICD-10-CM | POA: Diagnosis not present

## 2019-12-18 DIAGNOSIS — N261 Atrophy of kidney (terminal): Secondary | ICD-10-CM | POA: Diagnosis not present

## 2019-12-18 DIAGNOSIS — I129 Hypertensive chronic kidney disease with stage 1 through stage 4 chronic kidney disease, or unspecified chronic kidney disease: Secondary | ICD-10-CM | POA: Diagnosis not present

## 2019-12-18 DIAGNOSIS — M1990 Unspecified osteoarthritis, unspecified site: Secondary | ICD-10-CM | POA: Diagnosis not present

## 2019-12-18 DIAGNOSIS — N183 Chronic kidney disease, stage 3 unspecified: Secondary | ICD-10-CM | POA: Diagnosis not present

## 2019-12-18 DIAGNOSIS — J309 Allergic rhinitis, unspecified: Secondary | ICD-10-CM | POA: Diagnosis not present

## 2019-12-18 DIAGNOSIS — D631 Anemia in chronic kidney disease: Secondary | ICD-10-CM | POA: Diagnosis not present

## 2019-12-18 DIAGNOSIS — N2581 Secondary hyperparathyroidism of renal origin: Secondary | ICD-10-CM | POA: Diagnosis not present

## 2020-02-23 DIAGNOSIS — Z0000 Encounter for general adult medical examination without abnormal findings: Secondary | ICD-10-CM | POA: Diagnosis not present

## 2020-02-23 DIAGNOSIS — Z1389 Encounter for screening for other disorder: Secondary | ICD-10-CM | POA: Diagnosis not present
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
## 2020-03-02 ENCOUNTER — Encounter: Payer: Self-pay | Admitting: Internal Medicine

## 2020-03-02 ENCOUNTER — Other Ambulatory Visit: Payer: Self-pay

## 2020-03-02 ENCOUNTER — Ambulatory Visit (INDEPENDENT_AMBULATORY_CARE_PROVIDER_SITE_OTHER): Payer: Medicare Other | Admitting: Internal Medicine

## 2020-03-02 DIAGNOSIS — J449 Chronic obstructive pulmonary disease, unspecified: Secondary | ICD-10-CM

## 2020-03-02 DIAGNOSIS — J3089 Other allergic rhinitis: Secondary | ICD-10-CM

## 2020-03-02 DIAGNOSIS — J302 Other seasonal allergic rhinitis: Secondary | ICD-10-CM | POA: Diagnosis not present

## 2020-03-02 MED ORDER — TRELEGY ELLIPTA 200-62.5-25 MCG/INH IN AEPB: INHALATION_SPRAY | RESPIRATORY_TRACT | 4 refills | Status: DC

## 2020-03-02 NOTE — Patient Instructions (Signed)
We  Have sent script for Trelegy 200 so you have that available. You can use either -100 or -200, as you see fit.  Please call if we can help  Good luck getting established in [facility]

## 2020-03-15 DIAGNOSIS — R030 Elevated blood-pressure reading, without diagnosis of hypertension: Secondary | ICD-10-CM | POA: Diagnosis not present

## 2020-03-15 DIAGNOSIS — N400 Enlarged prostate without lower urinary tract symptoms: Secondary | ICD-10-CM | POA: Diagnosis not present
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
## 2020-04-08 NOTE — Assessment & Plan Note (Signed)
Usually controlled with OTC meds
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
## 2020-08-31 ENCOUNTER — Encounter: Payer: Self-pay | Admitting: Internal Medicine

## 2020-08-31 ENCOUNTER — Ambulatory Visit (INDEPENDENT_AMBULATORY_CARE_PROVIDER_SITE_OTHER): Payer: Medicare Other | Admitting: Internal Medicine

## 2020-08-31 ENCOUNTER — Other Ambulatory Visit: Payer: Self-pay

## 2020-08-31 DIAGNOSIS — J3089 Other allergic rhinitis: Secondary | ICD-10-CM

## 2020-08-31 DIAGNOSIS — J449 Chronic obstructive pulmonary disease, unspecified: Secondary | ICD-10-CM

## 2020-08-31 DIAGNOSIS — J302 Other seasonal allergic rhinitis: Secondary | ICD-10-CM

## 2020-08-31 MED ORDER — ALBUTEROL SULFATE HFA 108 (90 BASE) MCG/ACT IN AERS: INHALATION_SPRAY | RESPIRATORY_TRACT | 4 refills | Status: AC

## 2020-08-31 NOTE — Patient Instructions (Signed)
Rescue albuterol inhaler refilled  Glad you seem to be doing well- please cal if we can help

## 2021-01-24 NOTE — Assessment & Plan Note (Signed)
Controlled for now. No concerns and no change with move to [facility]

## 2021-01-24 NOTE — Assessment & Plan Note (Signed)
Moderate persistent uncomplicated Plan- refill and continue current meds
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
## 2021-08-31 ENCOUNTER — Ambulatory Visit (INDEPENDENT_AMBULATORY_CARE_PROVIDER_SITE_OTHER): Payer: Medicare Other | Admitting: Internal Medicine

## 2021-08-31 ENCOUNTER — Encounter: Payer: Self-pay | Admitting: Internal Medicine

## 2021-08-31 DIAGNOSIS — J302 Other seasonal allergic rhinitis: Secondary | ICD-10-CM | POA: Diagnosis not present

## 2021-08-31 DIAGNOSIS — J449 Chronic obstructive pulmonary disease, unspecified: Secondary | ICD-10-CM | POA: Diagnosis not present

## 2021-08-31 DIAGNOSIS — J3089 Other allergic rhinitis: Secondary | ICD-10-CM | POA: Diagnosis not present

## 2021-08-31 MED ORDER — TRELEGY ELLIPTA 200-62.5-25 MCG/ACT IN AEPB: INHALATION_SPRAY | RESPIRATORY_TRACT | 4 refills | Status: AC

## 2021-08-31 NOTE — Patient Instructions (Signed)
Trelegy refill sent to mail order  Suggest you discuss appropriateness of "Biologic" therapies ( Dupixent, Elwanda [place], etc.) with your Allergist.  Good luck going forward -please call if we can help.
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
## 2021-08-31 NOTE — Assessment & Plan Note (Signed)
He notes increased rhinitis and itching skin since moving to [place].
